# Patient Record
Sex: Female | Born: 1970 | Race: Black or African American | Hispanic: No | Marital: Single | Smoking: Never smoker
Health system: Southern US, Community
[De-identification: ages and names within clinical notes are randomized; demographics above are authoritative.]

## PROBLEM LIST (undated history)

## (undated) DIAGNOSIS — R0602 Shortness of breath: Secondary | ICD-10-CM

## (undated) DIAGNOSIS — R002 Palpitations: Secondary | ICD-10-CM

## (undated) DIAGNOSIS — K219 Gastro-esophageal reflux disease without esophagitis: Secondary | ICD-10-CM

## (undated) DIAGNOSIS — F32A Depression, unspecified: Secondary | ICD-10-CM

## (undated) DIAGNOSIS — F329 Major depressive disorder, single episode, unspecified: Secondary | ICD-10-CM

## (undated) DIAGNOSIS — F419 Anxiety disorder, unspecified: Secondary | ICD-10-CM

---

## 2004-12-04 ENCOUNTER — Inpatient Hospital Stay (HOSPITAL_COMMUNITY): Admission: AD | Admit: 2004-12-04 | Discharge: 2004-12-04 | Payer: Self-pay | Admitting: Obstetrics & Gynecology

## 2004-12-29 ENCOUNTER — Encounter: Admission: RE | Admit: 2004-12-29 | Discharge: 2005-03-29 | Payer: Self-pay | Admitting: Emergency Medicine

## 2007-11-23 ENCOUNTER — Other Ambulatory Visit: Admission: RE | Admit: 2007-11-23 | Discharge: 2007-11-23 | Payer: Self-pay | Admitting: Obstetrics and Gynecology

## 2008-09-07 HISTORY — PX: LAPAROSCOPIC GASTRIC BANDING: SHX1100

## 2008-10-30 ENCOUNTER — Ambulatory Visit: Payer: Self-pay | Admitting: Radiology

## 2008-10-30 ENCOUNTER — Ambulatory Visit (HOSPITAL_BASED_OUTPATIENT_CLINIC_OR_DEPARTMENT_OTHER): Admission: RE | Admit: 2008-10-30 | Discharge: 2008-10-30 | Payer: Self-pay | Admitting: Family Medicine

## 2009-01-17 ENCOUNTER — Other Ambulatory Visit: Admission: RE | Admit: 2009-01-17 | Discharge: 2009-01-17 | Payer: Self-pay | Admitting: Obstetrics and Gynecology

## 2013-03-25 ENCOUNTER — Encounter (HOSPITAL_COMMUNITY): Payer: Self-pay | Admitting: *Deleted

## 2013-03-25 ENCOUNTER — Emergency Department (HOSPITAL_COMMUNITY)
Admission: EM | Admit: 2013-03-25 | Discharge: 2013-03-26 | Disposition: A | Discharge status: Other Acute Inpatient Hospital | Payer: Commercial Managed Care - PPO | Attending: Emergency Medicine | Admitting: Emergency Medicine

## 2013-03-25 DIAGNOSIS — Z9884 Bariatric surgery status: Secondary | ICD-10-CM | POA: Insufficient documentation

## 2013-03-25 DIAGNOSIS — M7989 Other specified soft tissue disorders: Secondary | ICD-10-CM | POA: Insufficient documentation

## 2013-03-25 DIAGNOSIS — F329 Major depressive disorder, single episode, unspecified: Secondary | ICD-10-CM

## 2013-03-25 DIAGNOSIS — F3289 Other specified depressive episodes: Secondary | ICD-10-CM | POA: Insufficient documentation

## 2013-03-25 DIAGNOSIS — Z880 Allergy status to penicillin: Secondary | ICD-10-CM | POA: Insufficient documentation

## 2013-03-25 DIAGNOSIS — R45851 Suicidal ideations: Secondary | ICD-10-CM | POA: Insufficient documentation

## 2013-03-25 DIAGNOSIS — R45850 Homicidal ideations: Secondary | ICD-10-CM | POA: Insufficient documentation

## 2013-03-25 DIAGNOSIS — Z8679 Personal history of other diseases of the circulatory system: Secondary | ICD-10-CM | POA: Insufficient documentation

## 2013-03-25 DIAGNOSIS — Z79899 Other long term (current) drug therapy: Secondary | ICD-10-CM | POA: Insufficient documentation

## 2013-03-25 HISTORY — DX: Palpitations: R00.2

## 2013-03-25 HISTORY — DX: Depression, unspecified: F32.A

## 2013-03-25 HISTORY — DX: Major depressive disorder, single episode, unspecified: F32.9

## 2013-03-25 LAB — CBC WITH DIFFERENTIAL/PLATELET
Basophils Absolute: 0 10*3/uL (ref 0.0–0.1)
Eosinophils Relative: 1 % (ref 0–5)
Lymphocytes Relative: 20 % (ref 12–46)
Neutro Abs: 7.2 10*3/uL (ref 1.7–7.7)
Platelets: 318 10*3/uL (ref 150–400)
RDW: 13.6 % (ref 11.5–15.5)
WBC: 9.8 10*3/uL (ref 4.0–10.5)

## 2013-03-25 LAB — BASIC METABOLIC PANEL
CO2: 24 mEq/L (ref 19–32)
Calcium: 9.1 mg/dL (ref 8.4–10.5)
Chloride: 100 mEq/L (ref 96–112)
Sodium: 136 mEq/L (ref 135–145)

## 2013-03-25 LAB — RAPID URINE DRUG SCREEN, HOSP PERFORMED
Cocaine: NOT DETECTED
Opiates: NOT DETECTED
Tetrahydrocannabinol: NOT DETECTED

## 2013-03-25 MED ORDER — LORAZEPAM 1 MG PO TABS
ORAL_TABLET | ORAL | Status: AC
  Administered 2013-03-25: 1 mg via ORAL
  Filled 2013-03-25: qty 1

## 2013-03-25 MED ORDER — ZOLPIDEM TARTRATE 5 MG PO TABS
10.0000 mg | ORAL_TABLET | Freq: Once | ORAL | Status: AC
  Administered 2013-03-25: 10 mg via ORAL
  Filled 2013-03-25: qty 2

## 2013-03-25 MED ORDER — LORAZEPAM 1 MG PO TABS
1.0000 mg | ORAL_TABLET | Freq: Once | ORAL | Status: AC
  Administered 2013-03-25: 1 mg via ORAL

## 2013-03-25 NOTE — ED Provider Notes (Signed)
Pt accepted to [facility], Dr. Betti Cruz. Will transfer stable.   Leslie Anger, DO 03/25/13 1751

## 2013-03-25 NOTE — ED Provider Notes (Signed)
History    This chart was scribed for Shelda Jakes, MD by Leone Payor, ED Scribe. This patient was seen in room APA15/APA15 and the patient's care was started 10:47 AM.  CSN: [id number] Arrival date & time 03/25/13  1033  First MD Initiated Contact with Patient 03/25/13 1044     Chief Complaint  Patient presents with  . V70.1    The history is provided by the patient. No language interpreter was used.    HPI Comments: Leslie Day is a 42 y.o. female with past medical history of depression who presents to the Emergency Department complaining of ongoing, constant feelings of depression and SI/HI for the past 4 days. Pt admits to a plan and states "whatever it takes" when asked. States she wants to cause harm to people that have hurt her in the past. Per pt, she has attempted suicide in the past by consuming pills. Pt denies history of DM, HTN.    PCP Dr. Elwyn Reach at [facility].  Past Medical History  Diagnosis Date  . Depression   . Palpitation    Past Surgical History  Procedure Laterality Date  . Laparoscopic gastric banding     No family history on file. History  Substance Use Topics  . Smoking status: Never Smoker   . Smokeless tobacco: Not on file  . Alcohol Use: No   OB History   Grav Para Term Preterm Abortions TAB SAB Ect Mult Living                 Review of Systems  Constitutional: Negative for fever and chills.  HENT: Negative for sore throat, rhinorrhea and neck pain.   Eyes: Negative for visual disturbance.  Respiratory: Negative for cough and shortness of breath.   Cardiovascular: Positive for leg swelling. Negative for chest pain.  Gastrointestinal: Negative for nausea, vomiting, abdominal pain and diarrhea.  Genitourinary: Negative for dysuria and hematuria.  Musculoskeletal: Negative for back pain.  Skin: Negative for rash.  Neurological: Negative for headaches.  Hematological: Does not bruise/bleed easily.   Psychiatric/Behavioral: Positive for suicidal ideas. Negative for confusion.       Homicidal ideas    Allergies  Penicillins  Home Medications   Current Outpatient Rx  Name  Route  Sig  Dispense  Refill  . escitalopram (LEXAPRO) 20 MG tablet   Oral   Take 20 mg by mouth daily.         . metoprolol tartrate (LOPRESSOR) 25 MG tablet   Oral   Take 25 mg by mouth daily as needed (for palpitations).         . valACYclovir (VALTREX) 500 MG tablet   Oral   Take 500 mg by mouth daily.          Marland Kitchen zolpidem (AMBIEN CR) 12.5 MG CR tablet   Oral   Take 12.5 mg by mouth at bedtime as needed for sleep.          BP 140/78  Pulse 75  Temp(Src) 98.4 F (36.9 C) (Oral)  Resp 18  Ht 5\' 8"  (1.727 m)  Wt 317 lb (143.79 kg)  BMI 48.21 kg/m2  SpO2 98% Physical Exam  Nursing note and vitals reviewed. Constitutional: She is oriented to person, place, and time. She appears well-developed and well-nourished.  HENT:  Head: Normocephalic and atraumatic.  Eyes: Conjunctivae and EOM are normal. Pupils are equal, round, and reactive to light.  Neck: Normal range of motion. Neck supple.  Cardiovascular:  Normal rate, regular rhythm and normal heart sounds.  Exam reveals no gallop and no friction rub.   No murmur heard. Pulmonary/Chest: Effort normal and breath sounds normal. No respiratory distress. She has no wheezes. She has no rales. She exhibits no tenderness.  Abdominal: Soft. Bowel sounds are normal. She exhibits no distension and no mass. There is no tenderness. There is no rebound and no guarding.  Musculoskeletal: Normal range of motion.  Swelling on bilateral legs but not pitting edema.   Neurological: She is alert and oriented to person, place, and time.  Skin: Skin is warm and dry.  Psychiatric: She has a normal mood and affect.    ED Course  Procedures (including critical care time)  DIAGNOSTIC STUDIES: Oxygen Saturation is 100% on RA, normal by my interpretation.     COORDINATION OF CARE: 11:47 AM Discussed treatment plan with pt at bedside and pt agreed to plan.   Labs Reviewed  BASIC METABOLIC PANEL - Abnormal; Notable for the following:    Glucose, Bld 101 (*)    GFR calc non Af Amer 66 (*)    GFR calc Af Amer 76 (*)    All other components within normal limits  CBC WITH DIFFERENTIAL  URINE RAPID DRUG SCREEN (HOSP PERFORMED)   Results for orders placed during the hospital encounter of 03/25/13  CBC WITH DIFFERENTIAL      Result Value Range   WBC 9.8  4.0 - 10.5 K/uL   RBC 4.62  3.87 - 5.11 MIL/uL   Hemoglobin 12.7  12.0 - 15.0 g/dL   HCT 16.1  09.6 - 04.5 %   MCV 84.6  78.0 - 100.0 fL   MCH 27.5  26.0 - 34.0 pg   MCHC 32.5  30.0 - 36.0 g/dL   RDW 40.9  81.1 - 91.4 %   Platelets 318  150 - 400 K/uL   Neutrophils Relative % 74  43 - 77 %   Neutro Abs 7.2  1.7 - 7.7 K/uL   Lymphocytes Relative 20  12 - 46 %   Lymphs Abs 2.0  0.7 - 4.0 K/uL   Monocytes Relative 4  3 - 12 %   Monocytes Absolute 0.4  0.1 - 1.0 K/uL   Eosinophils Relative 1  0 - 5 %   Eosinophils Absolute 0.1  0.0 - 0.7 K/uL   Basophils Relative 0  0 - 1 %   Basophils Absolute 0.0  0.0 - 0.1 K/uL  BASIC METABOLIC PANEL      Result Value Range   Sodium 136  135 - 145 mEq/L   Potassium 3.5  3.5 - 5.1 mEq/L   Chloride 100  96 - 112 mEq/L   CO2 24  19 - 32 mEq/L   Glucose, Bld 101 (*) 70 - 99 mg/dL   BUN 11  6 - 23 mg/dL   Creatinine, Ser 7.82  0.50 - 1.10 mg/dL   Calcium 9.1  8.4 - 95.6 mg/dL   GFR calc non Af Amer 66 (*) >90 mL/min   GFR calc Af Amer 76 (*) >90 mL/min  URINE RAPID DRUG SCREEN (HOSP PERFORMED)      Result Value Range   Opiates NONE DETECTED  NONE DETECTED   Cocaine NONE DETECTED  NONE DETECTED   Benzodiazepines NONE DETECTED  NONE DETECTED   Amphetamines NONE DETECTED  NONE DETECTED   Tetrahydrocannabinol NONE DETECTED  NONE DETECTED   Barbiturates NONE DETECTED  NONE DETECTED    Date: 03/25/2013  Rate: 85  Rhythm: normal sinus rhythm  QRS  Axis: normal  Intervals: normal  ST/T Wave abnormalities: normal  Conduction Disutrbances:none  Narrative Interpretation:   Old EKG Reviewed: none available      No results found. 1. Suicidal ideation   2. Depression     MDM  Patient medically cleared for behavioral health evaluation. Patient has strong feelings of suicidal ideation and even expresses some homicidal ideation will most likely require psychiatric admission unless things change. Patient is currently voluntary.  I personally performed the services described in this documentation, which was scribed in my presence. The recorded information has been reviewed and is accurate.    Shelda Jakes, MD 03/25/13 [phone]

## 2013-03-25 NOTE — ED Notes (Signed)
Patient states "I feel so restless."  Dr. Deretha Emory notified, order recvd.

## 2013-03-25 NOTE — ED Notes (Signed)
Pt requesting something to help her sleep. EDP notified.

## 2013-03-25 NOTE — ED Notes (Signed)
Pt c/o depression, no appetite, SI/HI for the past 4 days, pt admits to plan, when asked what the plan is pt states "whatever it takes", admits to HI also. Pt tearful at triage.

## 2013-03-25 NOTE — ED Notes (Signed)
Patient unable to fit into xxl paper scrub pant.  Allowed to keep on her own pants, no strings on pants.  Patient wanded.

## 2013-03-25 NOTE — ED Notes (Signed)
[name] notified that pt will not be arriving until tomorrow.

## 2013-03-25 NOTE — BH Assessment (Signed)
[facility] Assessment Progress Note     Patient has been accepted by Dr. Betti Cruz at [facility]. She is going to [facility] [address] She will transfer by [facility]. Her friend is going to get her clothes and other personal items for her to have while she receives treatment. Patient has signed the voluntary admission form. She is apprehensive but appropriately so, as she has never received services for inpatient stabilization. Patient was given specific instruction from RN on what could be brought to the facility. Friend is very supportive. Explained some of the protocols; etc. Patient re-assured.  Shon Baton, LCSW, LCASA

## 2013-03-25 NOTE — BH Assessment (Signed)
Assessment Note   Leslie Day is an 42 y.o. female. She presents voluntarily in the emergency room with depression. She states she has experienced depression since she was an adolescent/young adult. She has been prescribed Zoloft some time ago, and noticed an improvement. After a long period of time, she thought she didn't need it anymore and stopped taking it. Over the past 6 months, she has steadily become more and more depressed. Her PCP started her on Lexapro about 2 months ago, 10mg  at first, then increased to 20mg ; but she has not noticed a significant improvement. Her depression as gotten somewhat worse by her report: she is not eating much-just picking at food; she is not sleeping-waking up around 2 or 3 AM and walking the floors until it is time for her to get ready for work. This week, she has had continuous crying spells. Her friend expressed great concern as the crying spells are lasting longer and more intensive. She has noted some increase in anxiety, noting it is this "constant worrying," that starts in the morning right before she goes to work. She feels that this is starting to impact her social and work environments. At work, she is irritable, agitated and has difficulty concentrating on tasks. 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 She notes the worrying has also gotten much worse this past week. She has been having thoughts of Suicide; she expresses that this had been passing thoughts of a while, but it is now more of a intentional/planned thought process, with feelings of wanting to take pills. She states she doesn't want to, she wants to get helps so she doesn't feel like this anymore. Recently, she has had thoughts of homicidal ideation; no specific person mentioned, but she reports she can relate to those in the media who do "things like that." She has no previous  issues with harming anyone; no assaults and no real thoughts of really harming others-these are just passing thoughts as described by the patient. She does have a family history of depression; reporting that her mother was treated for depression, though she never knew it until she was older. She states that her mother took Zoloft. She does not have any delusions or hallucinations. No history of substance abuse reported by the patient. She is flat, unexpressive and very apprehensive. She states she is scared of losing her job as she knows she is getting worse and can't seem to "stop how I feel." She has no previous outpatient therapy or inpatient Behavioral health services. Because of her increasing crying spells, thoughts of harming herself by taking an overdose, it is recommended the patient be treated inpatient for her safety. Patient is agreeable to sign herself in; though she expresses being "scared" as she has never had to do this before.   Axis I: Major Depression, Recurrent severe Axis II: Deferred Axis III: See medical history Axis IV: Financial issues; depression is impacting her ability to socialize and her job performance Axis V: GAF 15-25  Past Medical History:  Past Medical History  Diagnosis Date  . Depression   . Palpitation     Past Surgical History  Procedure Laterality Date  . Laparoscopic gastric banding      Family History: No family history on file.  Social History:  reports that she has never smoked. She does not have any  smokeless tobacco history on file. She reports that she does not drink alcohol or use illicit drugs.  Additional Social History:     CIWA: CIWA-Ar BP: 140/78 mmHg Pulse Rate: 75 COWS:    Allergies:  Allergies  Allergen Reactions  . Penicillins     Rash     Home Medications:  (Not in a hospital admission)  OB/GYN Status:  No LMP recorded. Patient has had an injection.  General Assessment Data Location of Assessment: [facility]  Assessment: Yes Living Arrangements: Alone Can pt return to current living arrangement?: Yes Admission Status: Voluntary Is patient capable of signing voluntary admission?: Yes Transfer from: Acute Hospital Referral Source: MD  Education Status Is patient currently in school?: No  Risk to self Suicidal Ideation: Yes-Currently Present Suicidal Intent: Yes-Currently Present Is patient at risk for suicide?: Yes Suicidal Plan?: Yes-Currently Present Specify Current Suicidal Plan:  (has thought about taking pills-OD) Access to Means: Yes Specify Access to Suicidal Means:  (pills) What has been your use of drugs/alcohol within the last 12 months?:  (none) Previous Attempts/Gestures: No Recent stressful life event(s): Financial Problems Persecutory voices/beliefs?: No Depression: Yes Depression Symptoms: Tearfulness;Isolating;Loss of interest in usual pleasures;Feeling angry/irritable Substance abuse history and/or treatment for substance abuse?: No Suicide prevention information given to non-admitted patients: Not applicable  Risk to Others Homicidal Ideation: Yes-Currently Present Thoughts of Harm to Others: No-Not Currently Present/Within Last 6 Months Current Homicidal Intent: No Current Homicidal Plan: No Access to Homicidal Means: No History of harm to others?: No Does patient have access to weapons?: No Criminal Charges Pending?: No Does patient have a court date: No  Psychosis Hallucinations: None noted Delusions: None noted  Mental Status Report Appear/Hygiene: Improved Eye Contact: Fair Motor Activity: Unremarkable Speech: Logical/coherent;Soft Level of Consciousness: Quiet/awake Mood: Depressed;Apprehensive;Helpless;Preoccupied Affect: Apprehensive;Appropriate to circumstance;Depressed Anxiety Level: Minimal Thought Processes: Relevant Judgement: Unimpaired Orientation: Person;Place;Time;Situation;Appropriate for developmental age Obsessive Compulsive  Thoughts/Behaviors: Minimal  Cognitive Functioning Concentration: Decreased Memory: Recent Intact;Remote Intact IQ: Average Insight: Fair Impulse Control: Fair Appetite: Poor Weight Loss:  (5) Sleep: Decreased Total Hours of Sleep: 4 Vegetative Symptoms: None  ADLScreening [facility]) Patient's cognitive ability adequate to safely complete daily activities?: Yes Patient able to express need for assistance with ADLs?: Yes Independently performs ADLs?: Yes (appropriate for developmental age)  Abuse/Neglect [facility]) Physical Abuse: Denies Verbal Abuse: Denies Sexual Abuse: Denies  Prior Inpatient Therapy Prior Inpatient Therapy: No  Prior Outpatient Therapy Prior Outpatient Therapy: No  ADL Screening (condition at time of admission) Patient's cognitive ability adequate to safely complete daily activities?: Yes Patient able to express need for assistance with ADLs?: Yes Independently performs ADLs?: Yes (appropriate for developmental age)       Abuse/Neglect Assessment (Assessment to be complete while patient is alone) Physical Abuse: Denies Verbal Abuse: Denies Sexual Abuse: Denies Values / Beliefs Cultural Requests During Hospitalization: None Spiritual Requests During Hospitalization: None        Additional Information 1:1 In Past 12 Months?: No CIRT Risk: No Elopement Risk: No Does patient have medical clearance?: Yes     Disposition:  Disposition Initial Assessment Completed for this Encounter: Yes Disposition of Patient: Inpatient treatment program Type of inpatient treatment program: Adult  On Site Evaluation by:  Dr. Deretha Emory Reviewed with Physician:  Dr. Darleene Cleaver, Giavonni Cizek H 03/25/2013 3:45 PM

## 2013-04-24 ENCOUNTER — Encounter (HOSPITAL_COMMUNITY): Payer: Self-pay | Admitting: *Deleted

## 2013-04-24 ENCOUNTER — Observation Stay (HOSPITAL_COMMUNITY)
Admission: EM | Admit: 2013-04-24 | Discharge: 2013-04-25 | Disposition: A | Discharge status: Home / Self Care | Payer: Commercial Managed Care - PPO | Attending: Internal Medicine | Admitting: Internal Medicine

## 2013-04-24 ENCOUNTER — Emergency Department (HOSPITAL_COMMUNITY): Payer: Commercial Managed Care - PPO

## 2013-04-24 DIAGNOSIS — K219 Gastro-esophageal reflux disease without esophagitis: Secondary | ICD-10-CM | POA: Diagnosis present

## 2013-04-24 DIAGNOSIS — F32A Depression, unspecified: Secondary | ICD-10-CM | POA: Diagnosis present

## 2013-04-24 DIAGNOSIS — F329 Major depressive disorder, single episode, unspecified: Secondary | ICD-10-CM | POA: Diagnosis present

## 2013-04-24 DIAGNOSIS — R002 Palpitations: Secondary | ICD-10-CM | POA: Diagnosis present

## 2013-04-24 DIAGNOSIS — Z79899 Other long term (current) drug therapy: Secondary | ICD-10-CM | POA: Insufficient documentation

## 2013-04-24 DIAGNOSIS — F3289 Other specified depressive episodes: Secondary | ICD-10-CM | POA: Insufficient documentation

## 2013-04-24 DIAGNOSIS — R079 Chest pain, unspecified: Secondary | ICD-10-CM

## 2013-04-24 DIAGNOSIS — R0789 Other chest pain: Principal | ICD-10-CM | POA: Diagnosis present

## 2013-04-24 HISTORY — DX: Shortness of breath: R06.02

## 2013-04-24 HISTORY — DX: Anxiety disorder, unspecified: F41.9

## 2013-04-24 HISTORY — DX: Gastro-esophageal reflux disease without esophagitis: K21.9

## 2013-04-24 LAB — CBC WITH DIFFERENTIAL/PLATELET
Basophils Relative: 0 % (ref 0–1)
Eosinophils Absolute: 0.1 10*3/uL (ref 0.0–0.7)
Eosinophils Relative: 2 % (ref 0–5)
Hemoglobin: 12.1 g/dL (ref 12.0–15.0)
MCH: 28.1 pg (ref 26.0–34.0)
MCHC: 33.2 g/dL (ref 30.0–36.0)
MCV: 84.7 fL (ref 78.0–100.0)
Monocytes Absolute: 0.5 10*3/uL (ref 0.1–1.0)
Monocytes Relative: 5 % (ref 3–12)
Neutrophils Relative %: 68 % (ref 43–77)

## 2013-04-24 LAB — BASIC METABOLIC PANEL
BUN: 14 mg/dL (ref 6–23)
Calcium: 8.7 mg/dL (ref 8.4–10.5)
Creatinine, Ser: 0.91 mg/dL (ref 0.50–1.10)
GFR calc Af Amer: 88 mL/min — ABNORMAL LOW (ref >90)
GFR calc non Af Amer: 76 mL/min — ABNORMAL LOW (ref >90)
Potassium: 3.7 mEq/L (ref 3.5–5.1)

## 2013-04-24 LAB — CREATININE, SERUM: GFR calc Af Amer: >90 mL/min (ref >90)

## 2013-04-24 LAB — TROPONIN I: Troponin I: <0.3 ng/mL (ref <0.30)

## 2013-04-24 LAB — CBC
HCT: 35 % — ABNORMAL LOW (ref 36.0–46.0)
MCH: 28.5 pg (ref 26.0–34.0)
MCV: 84.5 fL (ref 78.0–100.0)
RBC: 4.14 MIL/uL (ref 3.87–5.11)
RDW: 14.3 % (ref 11.5–15.5)
WBC: 10.8 10*3/uL — ABNORMAL HIGH (ref 4.0–10.5)

## 2013-04-24 MED ORDER — PANTOPRAZOLE SODIUM 40 MG PO TBEC
40.0000 mg | DELAYED_RELEASE_TABLET | Freq: Every day | ORAL | Status: DC
  Administered 2013-04-25: 40 mg via ORAL
  Filled 2013-04-24: qty 1

## 2013-04-24 MED ORDER — ACETAMINOPHEN 325 MG PO TABS: 650.0000 mg | ORAL_TABLET | Freq: Four times a day (QID) | ORAL | Status: DC | PRN

## 2013-04-24 MED ORDER — ACETAMINOPHEN 650 MG RE SUPP: 650.0000 mg | Freq: Four times a day (QID) | RECTAL | Status: DC | PRN

## 2013-04-24 MED ORDER — NITROGLYCERIN 0.3 MG SL SUBL
0.3000 mg | SUBLINGUAL_TABLET | SUBLINGUAL | Status: DC | PRN
  Filled 2013-04-24: qty 100

## 2013-04-24 MED ORDER — VALACYCLOVIR HCL 500 MG PO TABS
500.0000 mg | ORAL_TABLET | Freq: Every day | ORAL | Status: DC
  Filled 2013-04-24: qty 1

## 2013-04-24 MED ORDER — ONDANSETRON HCL 4 MG/2ML IJ SOLN: 4.0000 mg | Freq: Four times a day (QID) | INTRAMUSCULAR | Status: DC | PRN

## 2013-04-24 MED ORDER — HYDROCODONE-ACETAMINOPHEN 5-325 MG PO TABS: 1.0000 | ORAL_TABLET | Freq: Four times a day (QID) | ORAL | Status: DC | PRN

## 2013-04-24 MED ORDER — SODIUM CHLORIDE 0.9 % IJ SOLN: 3.0000 mL | Freq: Two times a day (BID) | INTRAMUSCULAR | Status: DC

## 2013-04-24 MED ORDER — ENOXAPARIN SODIUM 40 MG/0.4ML ~~LOC~~ SOLN
40.0000 mg | SUBCUTANEOUS | Status: DC
  Filled 2013-04-24 (×2): qty 0.4

## 2013-04-24 MED ORDER — ZOLPIDEM TARTRATE 5 MG PO TABS
5.0000 mg | ORAL_TABLET | Freq: Every evening | ORAL | Status: DC | PRN
  Administered 2013-04-24: 5 mg via ORAL
  Filled 2013-04-24: qty 1

## 2013-04-24 MED ORDER — ALUM & MAG HYDROXIDE-SIMETH 200-200-20 MG/5ML PO SUSP: 30.0000 mL | Freq: Four times a day (QID) | ORAL | Status: DC | PRN

## 2013-04-24 MED ORDER — METOPROLOL TARTRATE 25 MG PO TABS
25.0000 mg | ORAL_TABLET | Freq: Every day | ORAL | Status: DC | PRN
  Filled 2013-04-24: qty 1

## 2013-04-24 MED ORDER — ONDANSETRON HCL 4 MG PO TABS: 4.0000 mg | ORAL_TABLET | Freq: Four times a day (QID) | ORAL | Status: DC | PRN

## 2013-04-24 MED ORDER — NITROGLYCERIN 0.4 MG SL SUBL: 0.4000 mg | SUBLINGUAL_TABLET | SUBLINGUAL | Status: DC | PRN

## 2013-04-24 MED ORDER — ZOLPIDEM TARTRATE 5 MG PO TABS: 10.0000 mg | ORAL_TABLET | Freq: Every evening | ORAL | Status: DC | PRN

## 2013-04-24 MED ORDER — ESCITALOPRAM OXALATE 20 MG PO TABS
20.0000 mg | ORAL_TABLET | Freq: Every day | ORAL | Status: DC
  Administered 2013-04-24: 20 mg via ORAL
  Filled 2013-04-24 (×2): qty 1

## 2013-04-24 MED ORDER — ASPIRIN 81 MG PO CHEW
324.0000 mg | CHEWABLE_TABLET | Freq: Once | ORAL | Status: AC
  Administered 2013-04-24: 324 mg via ORAL
  Filled 2013-04-24: qty 4

## 2013-04-24 MED ORDER — SODIUM CHLORIDE 0.9 % IV SOLN
INTRAVENOUS | Status: DC
  Administered 2013-04-24: 20:00:00 via INTRAVENOUS

## 2013-04-24 MED ORDER — LORAZEPAM 1 MG PO TABS: 1.0000 mg | ORAL_TABLET | Freq: Three times a day (TID) | ORAL | Status: DC | PRN

## 2013-04-24 NOTE — ED Notes (Signed)
Pt reports feeling irregular HR on SAT. Pt reported feeling nauseated at that time. The irregular HR started this AM while at work. Pt sent to ED for further eval.

## 2013-04-24 NOTE — ED Provider Notes (Signed)
Transfer of care form Leslie Pel, PA-C at change in shift.  Leslie Day is a 42 y/o F with PMHx of palpitations and PVCs, depression presenting to the ED with chest discomfort that started yesterday and an episode that occurred this morning while at work - EKG performed while at work, PVCs noted and patient came to the ED. Patient presenting to the ED with shortness of breath with episodes of sweating. Patient reported that she has not felt well since Saturday - reported that on Saturday for had an acute onset of diaphoresis, shortness of breath, and nausea that lasted approximately a couple of hours.   Alert and oriented. Pleasant upon exam. Full ROM to upper and lower extremities. Pulses 2+/2+ to radial and DP, bilaterally. Heart rate and rhythm normal. Lungs clear to auscultation bilaterally. Cap refill < 3 seconds. Mild swelling noted to the ankles and legs bilaterally with negative pitting edema noted. Negative respiratory distress noted. Full sentences without difficulty.   Chest xray negative findings. CBC negative findings. BMP negative findings. First set of Troponins negative elevation noted. Second set of troponins to be ordered.   Discussed with patient that she will most likely be admitted to the hospital for cardiac rule out - placed in observation. Patient understood, agreed to plan, all questions answered.   5:20PM Spoke with Dr. Isidoro Donning, [facility], discussed case with Dr. Isidoro Donning - patient to be admitted to observation to Telemetry for cardiac rule out, Team 10. Dr. Isidoro Donning to come see patient.   5:24PM Dr. Isidoro Donning down to see patient. Patient admitted and orders placed.   Raymon Mutton, PA-C 04/25/13 [phone]

## 2013-04-24 NOTE — H&P (Signed)
History and Physical       Hospital Admission Note Date: 04/24/2013  Patient name: Leslie Day Medical record number: [id number] Date of birth: 1970/11/20 Age: 42 y.o. Gender: female PCP: BOOTH, SHERI L, MD    Chief Complaint:  Palpitations with chest discomfort  HPI: Patient is a 42 year old obese female with past medical history of depression and chronic palpitations presented to ED with complaints of worsening palpitations and chest discomfort for last 2 days. History was obtained from the patient who stated that she has history of chronic palpitations for years. She has seen cardiologist in [place] in 2012, had an echo done which was normal. Subsequently she had a Holter monitor in 2010 and and had shown '682 PVC's in 24hours'. She has no history of coronary artery disease or cardiac cath. She denies any history of any recent long-distance travels. She denies any smoking or alcohol use. She does report that she was started on Abilify 2 mg daily by Dr. Renato Gails, psychiatrist in [place] 3 weeks ago for depression. She has noticed the palpitations worsening after starting Abilify. Per patient, she has been feeling nauseous, diaphoretic, palpitations and off and on chest pain episodes since that day 2 days ago. The chest pain is mid-sternal, without radiation lasting for a few seconds, with no relation with exertion, comes and goes. She states she has a history of GERD but this chest pain is different EKG showed PVCs, on monitor patient has normal sinus rhythm. Hospitalist service was requested for admission for observation and further workup given the new symptoms of chest pain.  Review of Systems:  Constitutional: Denies fever, chills, diaphoresis, poor appetite and fatigue.  HEENT: Denies photophobia, eye pain, redness, hearing loss, ear pain, congestion, sore throat, rhinorrhea, sneezing, mouth sores, trouble swallowing, neck pain,  neck stiffness and tinnitus.   Respiratory: Denies SOB, DOE, cough, chest tightness,  and wheezing.   Cardiovascular: Please see history of present illness Gastrointestinal: Denies  vomiting, abdominal pain, diarrhea, constipation, blood in stool and abdominal distention. + nausea Genitourinary: Denies dysuria, urgency, frequency, hematuria, flank pain and difficulty urinating.  Musculoskeletal: Denies myalgias, back pain, joint swelling, arthralgias and gait problem.  Skin: Denies pallor, rash and wound.  Neurological: Denies dizziness, seizures, syncope, weakness, light-headedness, numbness and headaches.  Hematological: Denies adenopathy. Easy bruising, personal or family bleeding history  Psychiatric/Behavioral: Denies suicidal ideation, mood changes, confusion, nervousness, sleep disturbance and agitation  Past Medical History: Past Medical History  Diagnosis Date  . Depression   . Palpitation    Past Surgical History  Procedure Laterality Date  . Laparoscopic gastric banding      Medications: Prior to Admission medications   Medication Sig Start Date End Date Taking? Authorizing Provider  ARIPiprazole (ABILIFY) 2 MG tablet Take 2 mg by mouth at bedtime.   Yes Historical Provider, MD  escitalopram (LEXAPRO) 20 MG tablet Take 20 mg by mouth at bedtime.    Yes Historical Provider, MD  metoprolol tartrate (LOPRESSOR) 25 MG tablet Take 25 mg by mouth daily as needed (for palpitations).   Yes Historical Provider, MD  valACYclovir (VALTREX) 500 MG tablet Take 500 mg by mouth daily.    Yes Historical Provider, MD  zolpidem (AMBIEN CR) 12.5 MG CR tablet Take 12.5 mg by mouth at bedtime as needed for sleep.   Yes Historical Provider, MD    Allergies:   Allergies  Allergen Reactions  . Penicillins     Rash     Social History:  reports that  she has never smoked. She does not have any smokeless tobacco history on file. She reports that she does not drink alcohol or use illicit  drugs.  Family History: Patient reports that her grandmother had coronary disease, mother died of pulmonary embolism Physical Exam: Blood pressure 115/55, pulse 76, temperature 99.2 F (37.3 C), temperature source Oral, resp. rate 18, SpO2 100.00%. General: Alert, awake, oriented x3, in no acute distress. HEENT: normocephalic, atraumatic, anicteric sclera, pink conjunctiva, pupils equal and reactive to light and accomodation, oropharynx clear Neck: supple, no masses or lymphadenopathy, no goiter, no bruits  Heart: Regular rate and rhythm, without murmurs, rubs or gallops. Lungs: Clear to auscultation bilaterally, no wheezing, rales or rhonchi. Abdomen: obese, Soft, nontender, nondistended, positive bowel sounds, no masses. Extremities: No clubbing, cyanosis or edema with positive pedal pulses. Neuro: Grossly intact, no focal neurological deficits, strength 5/5 upper and lower extremities bilaterally Psych: alert and oriented x 3, normal mood and affect Skin: no rashes or lesions, warm and dry   LABS on Admission:  Basic Metabolic Panel:  Recent Labs Lab 04/24/13 1445  NA 138  K 3.7  CL 105  CO2 24  GLUCOSE 157*  BUN 14  CREATININE 0.91  CALCIUM 8.7   CBC:  Recent Labs Lab 04/24/13 1445  WBC 9.5  NEUTROABS 6.5  HGB 12.1  HCT 36.4  MCV 84.7  PLT 317   Cardiac Enzymes:  Recent Labs Lab 04/24/13 1445  TROPONINI <0.30   BNP: No components found with this basename: POCBNP,  CBG: No results found for this basename: GLUCAP,  in the last 168 hours   Radiological Exams on Admission: Dg Chest 2 View  04/24/2013   *RADIOLOGY REPORT*  Clinical Data: Heart palpitations.  Shortness of breath.  CHEST - 2 VIEW  Comparison: None.  Findings: Lungs are clear.  Heart size is normal.  No pneumothorax or pleural effusion.  IMPRESSION: Negative chest.   Original Report Authenticated By: Holley Dexter, M.D.  EKG showed rate of 95, normal sinus rhythm,  PVCs  Assessment/Plan Principal Problem:   Palpitation with atypical chest pain - Patient has a known history of chronic palpitations and was started on beta blocker as needed. Will admit overnight for observation on telemetry monitor to rule out any arrhythmias or paroxysmal A. fib  - Obtain TSH, D. dimer, urine drug screen, urine pregnancy test, rule out ACS, lipid panel - Discussed with cardiology, Dr. Jacinto Halim, will see patient for further recommendations - Will defer to cardiology for further workup, holter monitor/loop recorder, stress test vs stress ECHO   Active Problems: GERD: - Place on PPI    Depression - Continue Lexapro. Ativan as needed for anxiety - DC Abilify, advised patient to call her psychiatrist to make followup appointment.    DVT prophylaxis: Lovenox  CODE STATUS: Full Code  Further plan will depend as patient's clinical course evolves and further radiologic and laboratory data become available.   Time Spent on Admission: 1 hour  Wanell Lorenzi M.D. [facility] 04/24/2013, 5:48 PM Pager: [phone]  If 7PM-7AM, please contact night-coverage [email or web address] Password [email or web address]

## 2013-04-24 NOTE — ED Provider Notes (Signed)
CSN: [id number]     Arrival date & time 04/24/13  1355 History     First MD Initiated Contact with Patient 04/24/13 1358     Chief Complaint  Patient presents with  . Chest Pain   (Consider location/radiation/quality/duration/timing/severity/associated sxs/prior Treatment) HPI  Leslie Day is a morbidly obese 42 y.o.female with a significant PMH of depression and palpitation presents to the ER with complaints of palpitations this morning. She has a history of heart palpitations for which she did a halter monitor and was told that she was having PVC's but nothing was done. They also did an cardiac echo 3 years ago.  She endorses that for the past 4 years when she walks or increases her activity she gets sharp chest pains, SOB and some pains that radiate up into her jaw. She saw a cardiologist  In [place], once many years ago. No cath, no history of MI, no cigarettes, no Alcohol usage.   Yesterday pt said that she was diaphoretic, nauseous and tired and some chest pains. She recently started 2 mg Abilify and feels that this has increased her palpitations. Today she had some chest pain that lasted only a couple of seconds with some nausea and lethargy.     Past Medical History  Diagnosis Date  . Depression   . Palpitation    Past Surgical History  Procedure Laterality Date  . Laparoscopic gastric banding     No family history on file. History  Substance Use Topics  . Smoking status: Never Smoker   . Smokeless tobacco: Not on file  . Alcohol Use: No   OB History   Grav Para Term Preterm Abortions TAB SAB Ect Mult Living                 Review of Systems  ROS is negative unless otherwise stated in the HPI   Allergies  Penicillins  Home Medications   Current Outpatient Rx  Name  Route  Sig  Dispense  Refill  . escitalopram (LEXAPRO) 20 MG tablet   Oral   Take 20 mg by mouth daily.         . metoprolol tartrate (LOPRESSOR) 25 MG tablet   Oral   Take 25 mg by  mouth daily as needed (for palpitations).         . valACYclovir (VALTREX) 500 MG tablet   Oral   Take 500 mg by mouth daily.          Marland Kitchen zolpidem (AMBIEN CR) 12.5 MG CR tablet   Oral   Take 12.5 mg by mouth at bedtime as needed for sleep.          BP 121/91  Pulse 91  Temp(Src) 99.2 F (37.3 C) (Oral)  Resp 22  SpO2 100% Physical Exam  Nursing note and vitals reviewed. Constitutional: She appears well-developed and well-nourished. No distress.  HENT:  Head: Normocephalic and atraumatic.  Eyes: Pupils are equal, round, and reactive to light.  Neck: Normal range of motion. Neck supple.  Cardiovascular: Normal rate and regular rhythm.   Pulmonary/Chest: Effort normal.  Abdominal: Soft.  Neurological: She is alert.  Skin: Skin is warm and dry.    ED Course   Procedures (including critical care time)  Labs Reviewed - No data to display No results found. No diagnosis found.  MDM   Date: 04/24/2013  Rate: 95  Rhythm: sinus rhythm  QRS Axis: normal  Intervals: normal  ST/T Wave abnormalities: normal  Conduction Disutrbances:multiple  PVC's  Narrative Interpretation:   Old EKG Reviewed: unchanged    End of shift patient care handed off to oncoming PA-C. Due to exertional angina symptoms, if all labs are normal I recommend admittance for cardiac r/o   Dorthula Matas, PA-C 04/24/13 1503  Dorthula Matas, PA-C 04/24/13 1503

## 2013-04-25 LAB — DRUGS OF ABUSE SCREEN W/O ALC, ROUTINE URINE
Amphetamine Screen, Ur: NEGATIVE
Barbiturate Quant, Ur: NEGATIVE
Benzodiazepines.: NEGATIVE
Marijuana Metabolite: POSITIVE — AB
Methadone: NEGATIVE
Opiate Screen, Urine: NEGATIVE

## 2013-04-25 LAB — URINALYSIS, ROUTINE W REFLEX MICROSCOPIC
Bilirubin Urine: NEGATIVE
Hgb urine dipstick: NEGATIVE
Ketones, ur: NEGATIVE mg/dL
Protein, ur: NEGATIVE mg/dL
Urobilinogen, UA: 1 mg/dL (ref 0.0–1.0)

## 2013-04-25 LAB — TROPONIN I: Troponin I: <0.3 ng/mL (ref <0.30)

## 2013-04-25 LAB — URINE MICROSCOPIC-ADD ON

## 2013-04-25 LAB — CBC
HCT: 33.9 % — ABNORMAL LOW (ref 36.0–46.0)
Hemoglobin: 10.9 g/dL — ABNORMAL LOW (ref 12.0–15.0)
MCH: 27.3 pg (ref 26.0–34.0)
RBC: 4 MIL/uL (ref 3.87–5.11)

## 2013-04-25 LAB — BASIC METABOLIC PANEL
BUN: 13 mg/dL (ref 6–23)
Chloride: 107 mEq/L (ref 96–112)
GFR calc non Af Amer: >90 mL/min (ref >90)
Glucose, Bld: 115 mg/dL — ABNORMAL HIGH (ref 70–99)
Potassium: 3.7 mEq/L (ref 3.5–5.1)
Sodium: 138 mEq/L (ref 135–145)

## 2013-04-25 LAB — LIPID PANEL
Cholesterol: 128 mg/dL (ref 0–200)
Triglycerides: 97 mg/dL (ref <150)

## 2013-04-25 LAB — TSH: TSH: 2.805 u[IU]/mL (ref 0.350–4.500)

## 2013-04-25 LAB — POCT I-STAT TROPONIN I

## 2013-04-25 NOTE — ED Provider Notes (Signed)
Medical screening examination/treatment/procedure(s) were performed by non-physician practitioner and as supervising physician I was immediately available for consultation/collaboration.  Kisean Rollo R. Sharnee Douglass, MD 04/25/13 1531 

## 2013-04-25 NOTE — Progress Notes (Signed)
Utilization review completed.  

## 2013-04-25 NOTE — Consult Note (Signed)
CARDIOLOGY CONSULT NOTE  Patient ID: Leslie Day MRN: [id number] DOB/AGE: Mar 13, 1971 42 y.o.  Admit date: 04/24/2013 Referring Physician  Ripu Rai, MD Primary Physician:  BOOTH, Jonna Clark, MD Reason for Consultation  Palpitations and chest pain  HPI: Patient is a 42 year old obese female with past medical history of depression and chronic palpitations presented to ED with complaints of worsening palpitations and chest discomfort for last 2 days. History was obtained from the patient who stated that she has history of chronic palpitations for years. She has seen cardiologist in [place] in 2012, had an echo done which was normal. Subsequently she had a Holter monitor in 2010 and and had shown '682 PVC's in 24hours'. She has no history of coronary artery disease or cardiac cath. She denies any history of any recent long-distance travels. She denies any smoking or alcohol use. She does report that she was started on Abilify 2 mg daily by Dr. Renato Gails, psychiatrist in [place] 3 weeks ago for depression. She has noticed the palpitations worsening after starting Abilify. Per patient, she has been feeling nauseous, diaphoretic, palpitations and off and on chest pain episodes since that day 2 days ago. The chest pain is mid-sternal, without radiation lasting for a few seconds, with no relation with exertion, comes and goes. She states she has a history of GERD but this chest pain is different. EKG showed PVCs, on monitor patient has normal sinus rhythm.   Patient this morning states that she's feeling well and has not had any recurrence. No chest pain, no PND or orthopnea. On further questioning she clearly has daytime somnolence and wakes up tired and occasionally with headaches.  Past Medical History  Diagnosis Date  . Depression   . Palpitation   . Exertional shortness of breath   . GERD (gastroesophageal reflux disease)   . Anxiety      Past Surgical History  Procedure Laterality Date  .  Laparoscopic gastric banding  2010     History reviewed. No pertinent family history.   Social History: History   Social History  . Marital Status: Single    Spouse Name: N/A    Number of Children: N/A  . Years of Education: N/A   Occupational History  . Not on file.   Social History Main Topics  . Smoking status: Never Smoker   . Smokeless tobacco: Never Used  . Alcohol Use: No  . Drug Use: Yes    Special: Marijuana     Comment: 04/24/2013 "last weed was ~ 5 days ago"  . Sexual Activity: Yes   Other Topics Concern  . Not on file   Social History Narrative  . No narrative on file     Prescriptions prior to admission  Medication Sig Dispense Refill  . ARIPiprazole (ABILIFY) 2 MG tablet Take 2 mg by mouth at bedtime.      Marland Kitchen escitalopram (LEXAPRO) 20 MG tablet Take 20 mg by mouth at bedtime.       . metoprolol tartrate (LOPRESSOR) 25 MG tablet Take 25 mg by mouth daily as needed (for palpitations).      . valACYclovir (VALTREX) 500 MG tablet Take 500 mg by mouth daily.       Marland Kitchen zolpidem (AMBIEN CR) 12.5 MG CR tablet Take 12.5 mg by mouth at bedtime as needed for sleep.        Scheduled Meds: . enoxaparin (LOVENOX) injection  40 mg Subcutaneous Q24H  . escitalopram  20 mg Oral QHS  . pantoprazole  40 mg Oral Q0600  . sodium chloride  3 mL Intravenous Q12H  . valACYclovir  500 mg Oral Daily   Continuous Infusions:  PRN Meds:.acetaminophen, acetaminophen, alum & mag hydroxide-simeth, HYDROcodone-acetaminophen, LORazepam, metoprolol tartrate, nitroGLYCERIN, ondansetron (ZOFRAN) IV, ondansetron, zolpidem  ROS: General: no fevers/chills/night sweats Eyes: no blurry vision, diplopia, or amaurosis ENT: no sore throat or hearing loss Resp: no cough, wheezing, or hemoptysis GI: no abdominal pain, nausea, vomiting, diarrhea, or constipation GU: no dysuria, frequency, or hematuria Skin: no rash Neuro: no headache, numbness, tingling, or weakness of  extremities Musculoskeletal: no joint pain or swelling Heme: no bleeding, DVT, or easy bruising Endo: no polydipsia or polyuria    Physical Exam: Blood pressure 120/70, pulse 64, temperature 98.3 F (36.8 C), temperature source Oral, resp. rate 18, SpO2 99.00%.   General appearance: alert, cooperative, appears older than stated age and morbidly obese Lungs: clear to auscultation bilaterally Heart: regular rate and rhythm, S1, S2 normal, no murmur, click, rub or gallop Abdomen: soft, non-tender; bowel sounds normal; no masses,  no organomegaly and pannus present Extremities: extremities normal, atraumatic, no cyanosis or edema Pulses: 2+ and symmetric Neurologic: Grossly normal  Labs:   Lab Results  Component Value Date   WBC 9.8 04/25/2013   HGB 10.9* 04/25/2013   HCT 33.9* 04/25/2013   MCV 84.8 04/25/2013   PLT 286 04/25/2013    Recent Labs Lab 04/25/13 0032  NA 138  K 3.7  CL 107  CO2 23  BUN 13  CREATININE 0.77  CALCIUM 8.3*  GLUCOSE 115*   Lab Results  Component Value Date   TROPONINI <0.30 04/25/2013    Lipid Panel     Component Value Date/Time   CHOL 128 04/25/2013 0032   TRIG 97 04/25/2013 0032   HDL 35* 04/25/2013 0032   CHOLHDL 3.7 04/25/2013 0032   VLDL 19 04/25/2013 0032   LDLCALC 74 04/25/2013 0032    EKG: normal EKG, normal sinus rhythm, normal intervals. No ischemia    Radiology:  Dg Chest 2 View  04/24/2013   *RADIOLOGY REPORT*  Clinical Data: Heart palpitations.  Shortness of breath.  CHEST - 2 VIEW  Comparison: None.  Findings: Lungs are clear.  Heart size is normal.  No pneumothorax or pleural effusion.  IMPRESSION: Negative chest.   Original Report Authenticated By: Holley Dexter, M.D.      ASSESSMENT AND PLAN:  1. Palpitations clearly related to PACs and PVCs. Most of the symptoms are occurring at rest and with usual activities and not with exertional activities. 2. Morbid obesity with symptoms suggestive of obstructive sleep apnea. 3.  Chest pain, clearly atypical, probably related to GERD in a patient with morbid obesity and obstructive sleep apnea versus musculoskeletal. Physical examination is otherwise unremarkable with a normal EKG.  Recommendation: No further cardiac workup is indicated. I have reassured the patient. Most of her symptoms are related to rest and not with exertional activity, suggesting benign nature of the PVCs. There is no family history of premature coronary artery disease or sudden cardiac. Unless recurrence of symptoms I be happy to see her back in the office. She does need sleep study in the outpatient basis and can follow up with her PCP. Thank for the consultation.  Pamella Pert, MD 04/25/2013, 8:56 AM [facility] Pager: [phone] Office: [phone] If no answer Cell [phone]

## 2013-04-25 NOTE — Discharge Summary (Signed)
PATIENT DETAILS Name: Leslie Day Age: 42 y.o. Sex: female Date of Birth: [birth year] MRN: [id number]. Admit Date: 04/24/2013 Admitting Physician: Cathren Harsh, MD AOZ:[id number], Jonna Clark, MD  Recommendations for Outpatient Follow-up:  1. Please refer to sleep study 2. Gen. health maintenance  PRIMARY DISCHARGE DIAGNOSIS:  Principal Problem:   Palpitation Active Problems:   Atypical chest pain   Depression   GERD (gastroesophageal reflux disease)      PAST MEDICAL HISTORY: Past Medical History  Diagnosis Date  . Depression   . Palpitation   . Exertional shortness of breath   . GERD (gastroesophageal reflux disease)   . Anxiety     DISCHARGE MEDICATIONS:   Medication List    STOP taking these medications       ARIPiprazole 2 MG tablet  Commonly known as:  ABILIFY      TAKE these medications       escitalopram 20 MG tablet  Commonly known as:  LEXAPRO  Take 20 mg by mouth at bedtime.     metoprolol tartrate 25 MG tablet  Commonly known as:  LOPRESSOR  Take 25 mg by mouth daily as needed (for palpitations).     valACYclovir 500 MG tablet  Commonly known as:  VALTREX  Take 500 mg by mouth daily.     zolpidem 12.5 MG CR tablet  Commonly known as:  AMBIEN CR  Take 12.5 mg by mouth at bedtime as needed for sleep.        ALLERGIES:   Allergies  Allergen Reactions  . Ativan [Lorazepam] Other (See Comments)    Pt states medication gave her suicidal and homicidal ideations. Was told by MD to stop taking it.   Marland Kitchen Penicillins     Rash     BRIEF HPI:  See H&P, Labs, Consult and Test reports for all details in brief, patient was admitted for valuation of palpitations  CONSULTATIONS:   cardiology  PERTINENT RADIOLOGIC STUDIES: Dg Chest 2 View  04/24/2013   *RADIOLOGY REPORT*  Clinical Data: Heart palpitations.  Shortness of breath.  CHEST - 2 VIEW  Comparison: None.  Findings: Lungs are clear.  Heart size is normal.  No pneumothorax or pleural effusion.   IMPRESSION: Negative chest.   Original Report Authenticated By: Holley Dexter, M.D.     PERTINENT LAB RESULTS: CBC:  Recent Labs  04/24/13 2003 04/25/13 0032  WBC 10.8* 9.8  HGB 11.8* 10.9*  HCT 35.0* 33.9*  PLT 284 286   CMET CMP     Component Value Date/Time   NA 138 04/25/2013 0032   K 3.7 04/25/2013 0032   CL 107 04/25/2013 0032   CO2 23 04/25/2013 0032   GLUCOSE 115* 04/25/2013 0032   BUN 13 04/25/2013 0032   CREATININE 0.77 04/25/2013 0032   CALCIUM 8.3* 04/25/2013 0032   GFRNONAA >90 04/25/2013 0032   GFRAA >90 04/25/2013 0032    GFR The CrCl is unknown because both a height and weight (above a minimum accepted value) are required for this calculation. No results found for this basename: LIPASE, AMYLASE,  in the last 72 hours  Recent Labs  04/24/13 2003 04/25/13 0032 04/25/13 0740  TROPONINI <0.30 <0.30 <0.30   No components found with this basename: POCBNP,   Recent Labs  04/24/13 1800  DDIMER 0.40   No results found for this basename: HGBA1C,  in the last 72 hours  Recent Labs  04/25/13 0032  CHOL 128  HDL 35*  LDLCALC 74  TRIG  97  CHOLHDL 3.7    Recent Labs  04/24/13 1800  TSH 2.805   No results found for this basename: VITAMINB12, FOLATE, FERRITIN, TIBC, IRON, RETICCTPCT,  in the last 72 hours Coags: No results found for this basename: PT, INR,  in the last 72 hours Microbiology: No results found for this or any previous visit (from the past 240 hour(s)).   BRIEF HOSPITAL COURSE:    Patient is a 42 year old obese female with a history of chronic palpitations, depression who was admitted to [facility] overnight as an observation as she claimed that she had worsening of her usual palpitations. She was apparently recently started on Abilify by a psychiatrist a few weeks ago. Since admission, she has had no further symptoms, telemetry has been essentially unremarkable. She has been evaluated by Dr. Jacinto Halim, cardiology on call who has  recommended no further workup at this point. She however does need a referral for a sleep study, this can be done in the outpatient setting by her primary care practitioner. Patient is not keen on restarting Abilify, I have asked her to follow up with her primary psychiatrist and take this matter up with her. At this time she is stable for discharge. Further workup can be done in the outpatient setting.  Rest of her medical issues are stable   of note-a few years ago while in [place], she's had Holter monitoring and other extensive workup for palpitations, these all have been negative except for PVCs.   TODAY-DAY OF DISCHARGE:  Subjective:   Leslie Day today has no headache,no chest abdominal pain,no new weakness tingling or numbness, feels much better wants to go home today.   Objective:   Blood pressure 120/70, pulse 64, temperature 98.3 F (36.8 C), temperature source Oral, resp. rate 18, SpO2 99.00%.  Intake/Output Summary (Last 24 hours) at 04/25/13 0949 Last data filed at 04/25/13 0700  Gross per 24 hour  Intake    360 ml  Output      0 ml  Net    360 ml   There were no vitals filed for this visit.  Exam Awake Alert, Oriented *3, No new F.N deficits, Normal affect [place].AT,PERRAL Supple Neck,No JVD, No cervical lymphadenopathy appriciated.  Symmetrical Chest wall movement, Good air movement bilaterally, CTAB RRR,No Gallops,Rubs or new Murmurs, No Parasternal Heave +ve B.Sounds, Abd Soft, Non tender, No organomegaly appriciated, No rebound -guarding or rigidity. No Cyanosis, Clubbing or edema, No new Rash or bruise  DISCHARGE CONDITION: Stable  DISPOSITION: Home  DISCHARGE INSTRUCTIONS:    Activity:  As tolerated   Diet recommendation: Heart Healthy diet       Discharge Orders   Future Orders Complete By Expires   Call MD for:  persistant dizziness or light-headedness  As directed    Diet - low sodium heart healthy  As directed    Increase activity  slowly  As directed       Follow-up Information   Follow up with BOOTH, SHERI L, MD. Schedule an appointment as soon as possible for a visit in 1 week.   Specialty:  Physician Assistant   Contact information:   [address] [place] [address] [phone]       Schedule an appointment as soon as possible for a visit with Pamella Pert, MD. (As needed)    Specialty:  Cardiology   Contact information:   [address] [place] [address] [phone]  Total Time spent on discharge equals 45 minutes.  SignedJeoffrey Massed 04/25/2013 9:49 AM

## 2013-04-26 LAB — URINE CULTURE

## 2013-04-26 NOTE — ED Provider Notes (Signed)
Medical screening examination/treatment/procedure(s) were performed by non-physician practitioner and as supervising physician I was immediately available for consultation/collaboration.   Joya Gaskins, MD 04/26/13 1252

## 2013-05-01 LAB — THC (MARIJUANA), URINE, CONFIRMATION: Marijuana, Ur-Confirmation: 94 ng/mL — ABNORMAL HIGH

## 2013-06-01 ENCOUNTER — Encounter (HOSPITAL_COMMUNITY): Payer: Self-pay

## 2013-06-01 ENCOUNTER — Emergency Department (HOSPITAL_COMMUNITY): Payer: Commercial Managed Care - PPO

## 2013-06-01 ENCOUNTER — Inpatient Hospital Stay (HOSPITAL_COMMUNITY)
Admission: EM | Admit: 2013-06-01 | Discharge: 2013-06-04 | DRG: 348 | Disposition: A | Discharge status: Home / Self Care | Payer: Commercial Managed Care - PPO | Attending: General Surgery | Admitting: General Surgery

## 2013-06-01 DIAGNOSIS — K612 Anorectal abscess: Principal | ICD-10-CM | POA: Diagnosis present

## 2013-06-01 DIAGNOSIS — K611 Rectal abscess: Secondary | ICD-10-CM

## 2013-06-01 DIAGNOSIS — K219 Gastro-esophageal reflux disease without esophagitis: Secondary | ICD-10-CM | POA: Diagnosis present

## 2013-06-01 DIAGNOSIS — F411 Generalized anxiety disorder: Secondary | ICD-10-CM | POA: Diagnosis present

## 2013-06-01 DIAGNOSIS — Z6841 Body Mass Index (BMI) 40.0 and over, adult: Secondary | ICD-10-CM

## 2013-06-01 DIAGNOSIS — F3289 Other specified depressive episodes: Secondary | ICD-10-CM | POA: Diagnosis present

## 2013-06-01 DIAGNOSIS — F329 Major depressive disorder, single episode, unspecified: Secondary | ICD-10-CM | POA: Diagnosis present

## 2013-06-01 LAB — URINALYSIS, ROUTINE W REFLEX MICROSCOPIC
Bilirubin Urine: NEGATIVE
Glucose, UA: NEGATIVE mg/dL
Ketones, ur: NEGATIVE mg/dL
Nitrite: NEGATIVE
Specific Gravity, Urine: 1.025 (ref 1.005–1.030)
pH: 6 (ref 5.0–8.0)

## 2013-06-01 LAB — CBC WITH DIFFERENTIAL/PLATELET
Basophils Absolute: 0 10*3/uL (ref 0.0–0.1)
HCT: 34.3 % — ABNORMAL LOW (ref 36.0–46.0)
Hemoglobin: 11 g/dL — ABNORMAL LOW (ref 12.0–15.0)
Lymphocytes Relative: 11 % — ABNORMAL LOW (ref 12–46)
MCH: 27.5 pg (ref 26.0–34.0)
Monocytes Absolute: 0.8 10*3/uL (ref 0.1–1.0)
Monocytes Relative: 5 % (ref 3–12)
Neutro Abs: 12.9 10*3/uL — ABNORMAL HIGH (ref 1.7–7.7)
WBC: 15.5 10*3/uL — ABNORMAL HIGH (ref 4.0–10.5)

## 2013-06-01 LAB — SURGICAL PCR SCREEN
MRSA, PCR: NEGATIVE
Staphylococcus aureus: NEGATIVE

## 2013-06-01 LAB — URINE MICROSCOPIC-ADD ON

## 2013-06-01 LAB — BASIC METABOLIC PANEL
Chloride: 103 mEq/L (ref 96–112)
GFR calc Af Amer: >90 mL/min (ref >90)
Potassium: 3.5 mEq/L (ref 3.5–5.1)
Sodium: 139 mEq/L (ref 135–145)

## 2013-06-01 MED ORDER — ONDANSETRON HCL 4 MG/2ML IJ SOLN
4.0000 mg | Freq: Four times a day (QID) | INTRAMUSCULAR | Status: DC | PRN
  Filled 2013-06-01: qty 2

## 2013-06-01 MED ORDER — METRONIDAZOLE IN NACL 5-0.79 MG/ML-% IV SOLN
500.0000 mg | Freq: Three times a day (TID) | INTRAVENOUS | Status: DC
  Administered 2013-06-01: 500 mg via INTRAVENOUS
  Administered 2013-06-02: .5 g via INTRAVENOUS
  Administered 2013-06-02 (×2): 500 mg via INTRAVENOUS
  Filled 2013-06-01 (×13): qty 100

## 2013-06-01 MED ORDER — CIPROFLOXACIN IN D5W 400 MG/200ML IV SOLN
INTRAVENOUS | Status: AC
  Filled 2013-06-01: qty 200

## 2013-06-01 MED ORDER — IOHEXOL 300 MG/ML  SOLN
100.0000 mL | Freq: Once | INTRAMUSCULAR | Status: AC | PRN
  Administered 2013-06-01: 100 mL via INTRAVENOUS

## 2013-06-01 MED ORDER — LEVOFLOXACIN IN D5W 750 MG/150ML IV SOLN
750.0000 mg | INTRAVENOUS | Status: DC
  Administered 2013-06-01: 750 mg via INTRAVENOUS
  Filled 2013-06-01 (×2): qty 150

## 2013-06-01 MED ORDER — LACTATED RINGERS IV SOLN
INTRAVENOUS | Status: DC
  Administered 2013-06-02: 07:00:00 via INTRAVENOUS

## 2013-06-01 MED ORDER — METRONIDAZOLE IN NACL 5-0.79 MG/ML-% IV SOLN
INTRAVENOUS | Status: AC
  Filled 2013-06-01: qty 200

## 2013-06-01 MED ORDER — HYDROMORPHONE HCL PF 1 MG/ML IJ SOLN
1.0000 mg | INTRAMUSCULAR | Status: DC | PRN
  Administered 2013-06-01 – 2013-06-02 (×2): 1 mg via INTRAVENOUS
  Filled 2013-06-01 (×2): qty 1

## 2013-06-01 MED ORDER — CIPROFLOXACIN IN D5W 400 MG/200ML IV SOLN
400.0000 mg | Freq: Two times a day (BID) | INTRAVENOUS | Status: DC
  Administered 2013-06-01 – 2013-06-02 (×3): 400 mg via INTRAVENOUS
  Filled 2013-06-01 (×9): qty 200

## 2013-06-01 MED ORDER — HYDROMORPHONE HCL PF 1 MG/ML IJ SOLN
1.0000 mg | INTRAMUSCULAR | Status: DC | PRN
  Administered 2013-06-01 (×2): 1 mg via INTRAVENOUS
  Filled 2013-06-01 (×2): qty 1

## 2013-06-01 MED ORDER — HYDROMORPHONE HCL PF 1 MG/ML IJ SOLN
1.0000 mg | Freq: Once | INTRAMUSCULAR | Status: AC
  Administered 2013-06-01: 1 mg via INTRAVENOUS
  Filled 2013-06-01: qty 1

## 2013-06-01 MED ORDER — ZOLPIDEM TARTRATE 5 MG PO TABS
5.0000 mg | ORAL_TABLET | Freq: Every evening | ORAL | Status: DC | PRN
  Administered 2013-06-01 (×2): 5 mg via ORAL
  Filled 2013-06-01 (×3): qty 1

## 2013-06-01 MED ORDER — SODIUM CHLORIDE 0.9 % IV SOLN
INTRAVENOUS | Status: AC
  Administered 2013-06-01: 18:00:00 via INTRAVENOUS

## 2013-06-01 MED ORDER — SODIUM CHLORIDE 0.9 % IV BOLUS (SEPSIS)
500.0000 mL | Freq: Once | INTRAVENOUS | Status: AC
  Administered 2013-06-01: 500 mL via INTRAVENOUS

## 2013-06-01 MED ORDER — PANTOPRAZOLE SODIUM 40 MG IV SOLR
40.0000 mg | Freq: Every day | INTRAVENOUS | Status: DC
  Administered 2013-06-01: 40 mg via INTRAVENOUS
  Filled 2013-06-01: qty 40

## 2013-06-01 MED ORDER — IOHEXOL 300 MG/ML  SOLN
50.0000 mL | Freq: Once | INTRAMUSCULAR | Status: AC | PRN
  Administered 2013-06-01: 50 mL via ORAL

## 2013-06-01 MED ORDER — ENOXAPARIN SODIUM 40 MG/0.4ML ~~LOC~~ SOLN
40.0000 mg | SUBCUTANEOUS | Status: DC
  Administered 2013-06-01 – 2013-06-03 (×3): 40 mg via SUBCUTANEOUS
  Filled 2013-06-01 (×3): qty 0.4

## 2013-06-01 MED ORDER — ONDANSETRON HCL 4 MG/2ML IJ SOLN
4.0000 mg | Freq: Once | INTRAMUSCULAR | Status: AC
  Administered 2013-06-01: 4 mg via INTRAVENOUS
  Filled 2013-06-01: qty 2

## 2013-06-01 NOTE — ED Notes (Signed)
Pt states she was given an Rx for pain and a cream but they are not working. States she smoked weed last night to help with the pain.

## 2013-06-01 NOTE — ED Notes (Signed)
Complain of rectal pain due to hemorrhoids

## 2013-06-01 NOTE — ED Notes (Signed)
Patient unable to provide urine specimen at this time.  Stated "it[the urine] felt like it was just about to come out but then went back up".

## 2013-06-01 NOTE — ED Notes (Signed)
Pt states pain medication has worn off. Request more pain medication. EDP aware

## 2013-06-01 NOTE — ED Provider Notes (Signed)
CSN: [id number]     Arrival date & time 06/01/13  0932 History  This chart was scribed for Leslie Hutching, MD by Quintella Reichert, ED scribe.  This patient was seen in room APA12/APA12 and the patient's care was started at 10:34 AM.   Chief Complaint  Patient presents with  . Rectal Pain    The history is provided by the patient. No language interpreter was used.    HPI Comments: Leslie Day is a 42 y.o. female who presents to the Emergency Department complaining of moderate-to-severe aching perirectal pain that began one week ago.  Pt states her pain is exacerbated by moving her bowels.  She also complains of decreased urinary flow and poor appetite along with low-grade fever as associated symptoms.  She denies abdominal pain.  She was prescribed rectal ointment and pain medication by her PCP, which she has been using without relief.     Past Medical History  Diagnosis Date  . Depression   . Palpitation   . Exertional shortness of breath   . GERD (gastroesophageal reflux disease)   . Anxiety     Past Surgical History  Procedure Laterality Date  . Laparoscopic gastric banding  2010    No family history on file.   History  Substance Use Topics  . Smoking status: Never Smoker   . Smokeless tobacco: Never Used  . Alcohol Use: No    OB History   Grav Para Term Preterm Abortions TAB SAB Ect Mult Living                  Review of Systems A complete 10 system review of systems was obtained and all systems are negative except as noted in the HPI and PMH.    Allergies  Ativan and Penicillins  Home Medications   Current Outpatient Rx  Name  Route  Sig  Dispense  Refill  . escitalopram (LEXAPRO) 20 MG tablet   Oral   Take 20 mg by mouth at bedtime.          Marland Kitchen HYDROcodone-acetaminophen (NORCO/VICODIN) 5-325 MG per tablet   Oral   Take 1 tablet by mouth every 4 (four) hours as needed for pain.         . hydrocortisone (ANUSOL-HC) 2.5 % rectal cream   Rectal    Place 1 application rectally 2 (two) times daily.         . metoprolol tartrate (LOPRESSOR) 25 MG tablet   Oral   Take 25 mg by mouth daily as needed (for palpitations).         . valACYclovir (VALTREX) 500 MG tablet   Oral   Take 500 mg by mouth daily.          Marland Kitchen zolpidem (AMBIEN CR) 12.5 MG CR tablet   Oral   Take 12.5 mg by mouth at bedtime as needed for sleep.          BP 138/57  Pulse 68  Temp(Src) 100.3 F (37.9 C) (Oral)  Resp 15  Ht 5\' 7"  (1.702 m)  Wt 300 lb (136.079 kg)  BMI 46.98 kg/m2  SpO2 97%  Physical Exam  Nursing note and vitals reviewed. Constitutional: She is oriented to person, place, and time. She appears well-developed and well-nourished.  HENT:  Head: Normocephalic and atraumatic.  Eyes: Conjunctivae and EOM are normal. Pupils are equal, round, and reactive to light.  Neck: Normal range of motion. Neck supple.  Cardiovascular: Normal rate, regular rhythm and normal  heart sounds.   Pulmonary/Chest: Effort normal and breath sounds normal.  Abdominal: Soft. Bowel sounds are normal. There is no tenderness.  Obese  Genitourinary:  External rectum normal.  Slightly tender at 6 o'clock to soft tissue surrounding rectum.  No obvious hemorrhoidal tissue.  Musculoskeletal: Normal range of motion.  Neurological: She is alert and oriented to person, place, and time.  Skin: Skin is warm and dry.  Psychiatric: She has a normal mood and affect.    ED Course  Procedures (including critical care time)  DIAGNOSTIC STUDIES: Oxygen Saturation is 97% on room air, normal by my interpretation.    COORDINATION OF CARE: 10:39 AM-Discussed treatment plan which includes pain medication and imaging with pt at bedside and pt agreed to plan.    Results for orders placed during the hospital encounter of 06/01/13  BASIC METABOLIC PANEL      Result Value Range   Sodium 139  135 - 145 mEq/L   Potassium 3.5  3.5 - 5.1 mEq/L   Chloride 103  96 - 112 mEq/L   CO2  26  19 - 32 mEq/L   Glucose, Bld 98  70 - 99 mg/dL   BUN 7  6 - 23 mg/dL   Creatinine, Ser 1.61  0.50 - 1.10 mg/dL   Calcium 9.0  8.4 - 09.6 mg/dL   GFR calc non Af Amer >90  >90 mL/min   GFR calc Af Amer >90  >90 mL/min  CBC WITH DIFFERENTIAL      Result Value Range   WBC 15.5 (*) 4.0 - 10.5 K/uL   RBC 4.00  3.87 - 5.11 MIL/uL   Hemoglobin 11.0 (*) 12.0 - 15.0 g/dL   HCT 04.5 (*) 40.9 - 81.1 %   MCV 85.8  78.0 - 100.0 fL   MCH 27.5  26.0 - 34.0 pg   MCHC 32.1  30.0 - 36.0 g/dL   RDW 91.4  78.2 - 95.6 %   Platelets 316  150 - 400 K/uL   Neutrophils Relative % 83 (*) 43 - 77 %   Neutro Abs 12.9 (*) 1.7 - 7.7 K/uL   Lymphocytes Relative 11 (*) 12 - 46 %   Lymphs Abs 1.7  0.7 - 4.0 K/uL   Monocytes Relative 5  3 - 12 %   Monocytes Absolute 0.8  0.1 - 1.0 K/uL   Eosinophils Relative 0  0 - 5 %   Eosinophils Absolute 0.1  0.0 - 0.7 K/uL   Basophils Relative 0  0 - 1 %   Basophils Absolute 0.0  0.0 - 0.1 K/uL  URINALYSIS, ROUTINE W REFLEX MICROSCOPIC      Result Value Range   Color, Urine YELLOW  YELLOW   APPearance HAZY (*) CLEAR   Specific Gravity, Urine 1.025  1.005 - 1.030   pH 6.0  5.0 - 8.0   Glucose, UA NEGATIVE  NEGATIVE mg/dL   Hgb urine dipstick LARGE (*) NEGATIVE   Bilirubin Urine NEGATIVE  NEGATIVE   Ketones, ur NEGATIVE  NEGATIVE mg/dL   Protein, ur NEGATIVE  NEGATIVE mg/dL   Urobilinogen, UA 0.2  0.0 - 1.0 mg/dL   Nitrite NEGATIVE  NEGATIVE   Leukocytes, UA NEGATIVE  NEGATIVE  URINE MICROSCOPIC-ADD ON      Result Value Range   Squamous Epithelial / LPF MANY (*) RARE   WBC, UA 21-50  <3 WBC/hpf   RBC / HPF 11-20  <3 RBC/hpf   Bacteria, UA MANY (*) RARE   Urine-Other  MUCOUS PRESENT      Ct Abdomen Pelvis W Contrast  06/01/2013   CLINICAL DATA:  Rectal pain. Evaluate for a perirectal abscess.  EXAM: CT ABDOMEN AND PELVIS WITH CONTRAST  TECHNIQUE: Multidetector CT imaging of the abdomen and pelvis was performed using the standard protocol following bolus  administration of intravenous contrast.  CONTRAST:  OMNIPAQUE IOHEXOL 300 MG/ML SOLN, 50mL OMNIPAQUE IOHEXOL 300 MG/ML SOLN  COMPARISON:  None.  FINDINGS: There is a heterogeneous collection along the left perineum contiguous with the anus. This is consistent with a perirectal abscess. It measures 6.7 cm x 4.4 cm in greatest transverse dimensions. It lies below the level of the levator sling. There is some hazy inflammatory type right stranding in the perirectal fat, but no other evidence of an abscess.  Clear lung bases. The heart is normal in size. The liver, spleen, gallbladder and pancreas are unremarkable. There is no bile duct dilation. The adrenal glands are normal in size.  Normal kidneys, ureters and bladder. The uterus and adnexa are unremarkable.  The colon and small bowel are unremarkable. A normal appendix is not discretely seen, but there is no evidence of appendicitis.  No pathologically enlarged lymph nodes.  No significant bony abnormality.  Lap band port lies in the left anterior upper abdomen in the deep subcutaneous soft tissues. The catheter is intact and the band portion is well positioned.  IMPRESSION: 1. Perianal abscess along the left aspect of the anus and perineum. This lies below the levator sling. 2. No abscess is seen above the levator sling. 3. Well positioned lap band. 4. No other abnormalities.   Electronically Signed   By: Amie Portland   On: 06/01/2013 12:45     MDM  No diagnosis found. CT scan reveals a 6.7 x 4.4 CM perirectal abscess.  Urinalysis shows 21-50 white cells. IV Piperacillin.   Admit to general surgery   I personally performed the services described in this documentation, which was scribed in my presence. The recorded information has been reviewed and is accurate.    Leslie Hutching, MD 06/01/13 1450

## 2013-06-02 ENCOUNTER — Inpatient Hospital Stay (HOSPITAL_COMMUNITY): Payer: Commercial Managed Care - PPO | Admitting: Anesthesiology

## 2013-06-02 ENCOUNTER — Encounter (HOSPITAL_COMMUNITY): Payer: Self-pay | Admitting: Anesthesiology

## 2013-06-02 ENCOUNTER — Encounter (HOSPITAL_COMMUNITY)
Admission: EM | Disposition: A | Discharge status: Home / Self Care | Payer: Self-pay | Source: Home / Self Care | Attending: General Surgery

## 2013-06-02 HISTORY — PX: INCISION AND DRAINAGE PERIRECTAL ABSCESS: SHX1804

## 2013-06-02 LAB — URINE CULTURE: Colony Count: NO GROWTH

## 2013-06-02 LAB — CBC
Hemoglobin: 10.2 g/dL — ABNORMAL LOW (ref 12.0–15.0)
MCH: 27.7 pg (ref 26.0–34.0)
Platelets: 293 10*3/uL (ref 150–400)
RBC: 3.68 MIL/uL — ABNORMAL LOW (ref 3.87–5.11)
RDW: 13.1 % (ref 11.5–15.5)
WBC: 9.8 10*3/uL (ref 4.0–10.5)

## 2013-06-02 LAB — BASIC METABOLIC PANEL
Calcium: 8.4 mg/dL (ref 8.4–10.5)
GFR calc non Af Amer: 76 mL/min — ABNORMAL LOW (ref >90)
Glucose, Bld: 89 mg/dL (ref 70–99)
Potassium: 3.4 mEq/L — ABNORMAL LOW (ref 3.5–5.1)
Sodium: 139 mEq/L (ref 135–145)

## 2013-06-02 SURGERY — INCISION AND DRAINAGE, ABSCESS, PERIRECTAL
Anesthesia: General | Wound class: Dirty or Infected

## 2013-06-02 MED ORDER — VALACYCLOVIR HCL 500 MG PO TABS
500.0000 mg | ORAL_TABLET | Freq: Every day | ORAL | Status: DC
  Administered 2013-06-02 – 2013-06-04 (×3): 500 mg via ORAL
  Filled 2013-06-02 (×8): qty 1

## 2013-06-02 MED ORDER — PROPOFOL 10 MG/ML IV BOLUS
INTRAVENOUS | Status: DC | PRN
  Administered 2013-06-02: 180 mg via INTRAVENOUS

## 2013-06-02 MED ORDER — DEXTROSE 5 % IV SOLN
INTRAVENOUS | Status: DC | PRN
  Administered 2013-06-02: 10:00:00 via INTRAVENOUS

## 2013-06-02 MED ORDER — LIDOCAINE HCL (PF) 1 % IJ SOLN
INTRAMUSCULAR | Status: AC
  Filled 2013-06-02: qty 5

## 2013-06-02 MED ORDER — FENTANYL CITRATE 0.05 MG/ML IJ SOLN
25.0000 ug | INTRAMUSCULAR | Status: DC
  Administered 2013-06-02 (×2): 25 ug via INTRAVENOUS
  Filled 2013-06-02: qty 2

## 2013-06-02 MED ORDER — ESCITALOPRAM OXALATE 10 MG PO TABS
20.0000 mg | ORAL_TABLET | Freq: Every day | ORAL | Status: DC
  Administered 2013-06-02 – 2013-06-03 (×2): 20 mg via ORAL
  Filled 2013-06-02 (×2): qty 2

## 2013-06-02 MED ORDER — MIDAZOLAM HCL 2 MG/2ML IJ SOLN
INTRAMUSCULAR | Status: AC
  Filled 2013-06-02: qty 2

## 2013-06-02 MED ORDER — MIDAZOLAM HCL 5 MG/5ML IJ SOLN
INTRAMUSCULAR | Status: DC | PRN
  Administered 2013-06-02: 2 mg via INTRAVENOUS

## 2013-06-02 MED ORDER — PROPOFOL 10 MG/ML IV EMUL
INTRAVENOUS | Status: AC
  Filled 2013-06-02: qty 20

## 2013-06-02 MED ORDER — SUCCINYLCHOLINE CHLORIDE 20 MG/ML IJ SOLN
INTRAMUSCULAR | Status: DC | PRN
  Administered 2013-06-02: 180 mg via INTRAVENOUS

## 2013-06-02 MED ORDER — HYDROCODONE-ACETAMINOPHEN 5-325 MG PO TABS
1.0000 | ORAL_TABLET | ORAL | Status: DC | PRN
  Administered 2013-06-02 (×3): 2 via ORAL
  Filled 2013-06-02 (×3): qty 2

## 2013-06-02 MED ORDER — OXYCODONE-ACETAMINOPHEN 5-325 MG PO TABS
1.0000 | ORAL_TABLET | ORAL | Status: DC | PRN
  Administered 2013-06-02 – 2013-06-03 (×2): 2 via ORAL
  Administered 2013-06-03: 1 via ORAL
  Administered 2013-06-03: 2 via ORAL
  Administered 2013-06-04 (×2): 1 via ORAL
  Filled 2013-06-02: qty 1
  Filled 2013-06-02 (×2): qty 2
  Filled 2013-06-02 (×2): qty 1
  Filled 2013-06-02: qty 2

## 2013-06-02 MED ORDER — SUCCINYLCHOLINE CHLORIDE 20 MG/ML IJ SOLN
INTRAMUSCULAR | Status: AC
  Filled 2013-06-02: qty 1

## 2013-06-02 MED ORDER — ROCURONIUM BROMIDE 50 MG/5ML IV SOLN
INTRAVENOUS | Status: AC
  Filled 2013-06-02: qty 1

## 2013-06-02 MED ORDER — LIDOCAINE HCL 1 % IJ SOLN
INTRAMUSCULAR | Status: DC | PRN
  Administered 2013-06-02: 50 mg via INTRADERMAL

## 2013-06-02 MED ORDER — CIPROFLOXACIN HCL 250 MG PO TABS
500.0000 mg | ORAL_TABLET | Freq: Two times a day (BID) | ORAL | Status: DC
  Administered 2013-06-02 – 2013-06-04 (×4): 500 mg via ORAL
  Filled 2013-06-02 (×4): qty 2

## 2013-06-02 MED ORDER — ONDANSETRON HCL 4 MG/2ML IJ SOLN
4.0000 mg | Freq: Once | INTRAMUSCULAR | Status: AC
  Administered 2013-06-02: 4 mg via INTRAVENOUS
  Filled 2013-06-02: qty 2

## 2013-06-02 MED ORDER — FENTANYL CITRATE 0.05 MG/ML IJ SOLN
INTRAMUSCULAR | Status: AC
  Filled 2013-06-02: qty 5

## 2013-06-02 MED ORDER — HYDROMORPHONE HCL PF 1 MG/ML IJ SOLN
1.0000 mg | INTRAMUSCULAR | Status: DC | PRN
  Administered 2013-06-02 (×2): 1 mg via INTRAVENOUS
  Filled 2013-06-02 (×2): qty 1

## 2013-06-02 MED ORDER — ZOLPIDEM TARTRATE 5 MG PO TABS
5.0000 mg | ORAL_TABLET | Freq: Every evening | ORAL | Status: DC | PRN
  Administered 2013-06-03 (×2): 5 mg via ORAL
  Filled 2013-06-02: qty 1

## 2013-06-02 MED ORDER — ROCURONIUM BROMIDE 100 MG/10ML IV SOLN
INTRAVENOUS | Status: DC | PRN
  Administered 2013-06-02: 25 mg via INTRAVENOUS

## 2013-06-02 MED ORDER — FENTANYL CITRATE 0.05 MG/ML IJ SOLN
INTRAMUSCULAR | Status: DC | PRN
  Administered 2013-06-02: 150 ug via INTRAVENOUS
  Administered 2013-06-02 (×2): 50 ug via INTRAVENOUS

## 2013-06-02 MED ORDER — LACTATED RINGERS IV SOLN
INTRAVENOUS | Status: DC
  Administered 2013-06-02: 09:00:00 via INTRAVENOUS

## 2013-06-02 MED ORDER — SODIUM CHLORIDE 0.9 % IR SOLN
Status: DC | PRN
  Administered 2013-06-02: 1000 mL

## 2013-06-02 MED ORDER — METRONIDAZOLE 500 MG PO TABS
500.0000 mg | ORAL_TABLET | Freq: Three times a day (TID) | ORAL | Status: DC
  Administered 2013-06-02 – 2013-06-04 (×6): 500 mg via ORAL
  Filled 2013-06-02 (×6): qty 1

## 2013-06-02 MED ORDER — BUPIVACAINE HCL (PF) 0.5 % IJ SOLN
INTRAMUSCULAR | Status: AC
  Filled 2013-06-02: qty 30

## 2013-06-02 MED ORDER — METOPROLOL TARTRATE 25 MG PO TABS: 25.0000 mg | ORAL_TABLET | Freq: Every day | ORAL | Status: DC | PRN

## 2013-06-02 MED ORDER — GLYCOPYRROLATE 0.2 MG/ML IJ SOLN
INTRAMUSCULAR | Status: DC | PRN
  Administered 2013-06-02: 0.4 mg via INTRAVENOUS

## 2013-06-02 MED ORDER — GLYCOPYRROLATE 0.2 MG/ML IJ SOLN
INTRAMUSCULAR | Status: AC
  Filled 2013-06-02: qty 2

## 2013-06-02 MED ORDER — NEOSTIGMINE METHYLSULFATE 1 MG/ML IJ SOLN
INTRAMUSCULAR | Status: DC | PRN
  Administered 2013-06-02 (×3): 1 mg via INTRAVENOUS

## 2013-06-02 MED ORDER — ONDANSETRON HCL 4 MG/2ML IJ SOLN: 4.0000 mg | Freq: Once | INTRAMUSCULAR | Status: DC | PRN

## 2013-06-02 MED ORDER — FENTANYL CITRATE 0.05 MG/ML IJ SOLN
INTRAMUSCULAR | Status: AC
  Filled 2013-06-02: qty 2

## 2013-06-02 MED ORDER — MIDAZOLAM HCL 2 MG/2ML IJ SOLN
1.0000 mg | INTRAMUSCULAR | Status: DC | PRN
Start: 2013-06-02 — End: 2013-06-02
  Administered 2013-06-02: 2 mg via INTRAVENOUS

## 2013-06-02 MED ORDER — FENTANYL CITRATE 0.05 MG/ML IJ SOLN
25.0000 ug | INTRAMUSCULAR | Status: DC | PRN
  Administered 2013-06-02 (×2): 50 ug via INTRAVENOUS

## 2013-06-02 MED ORDER — NEOSTIGMINE METHYLSULFATE 1 MG/ML IJ SOLN
INTRAMUSCULAR | Status: AC
  Filled 2013-06-02: qty 1

## 2013-06-02 MED ORDER — ENOXAPARIN SODIUM 40 MG/0.4ML ~~LOC~~ SOLN
SUBCUTANEOUS | Status: AC
  Filled 2013-06-02: qty 0.4

## 2013-06-02 MED FILL — Iohexol IV Soln 350 MG/ML: INTRAVENOUS | Qty: 100 | Status: AC

## 2013-06-02 SURGICAL SUPPLY — 19 items
BAG HAMPER (MISCELLANEOUS) ×2 IMPLANT
CLOTH BEACON ORANGE TIMEOUT ST (SAFETY) ×2 IMPLANT
COVER LIGHT HANDLE STERIS (MISCELLANEOUS) ×4 IMPLANT
ELECT REM PT RETURN 9FT ADLT (ELECTROSURGICAL) ×2
ELECTRODE REM PT RTRN 9FT ADLT (ELECTROSURGICAL) ×1 IMPLANT
GLOVE BIOGEL PI IND STRL 7.5 (GLOVE) ×1 IMPLANT
GLOVE BIOGEL PI INDICATOR 7.5 (GLOVE) ×2
GLOVE ECLIPSE 7.0 STRL STRAW (GLOVE) ×3 IMPLANT
GLOVE EXAM NITRILE LRG STRL (GLOVE) ×1 IMPLANT
GOWN STRL REIN XL XLG (GOWN DISPOSABLE) ×5 IMPLANT
KIT ROOM TURNOVER APOR (KITS) ×2 IMPLANT
MANIFOLD NEPTUNE II (INSTRUMENTS) ×2 IMPLANT
NS IRRIG 1000ML POUR BTL (IV SOLUTION) ×2 IMPLANT
PACK PERI GYN (CUSTOM PROCEDURE TRAY) ×1 IMPLANT
PAD ARMBOARD 7.5X6 YLW CONV (MISCELLANEOUS) ×2 IMPLANT
SET BASIN LINEN APH (SET/KITS/TRAYS/PACK) ×2 IMPLANT
SPONGE GAUZE 4X4 12PLY (GAUZE/BANDAGES/DRESSINGS) ×2 IMPLANT
SYR BULB IRRIGATION 50ML (SYRINGE) ×2 IMPLANT
SYR CONTROL 10ML LL (SYRINGE) ×2 IMPLANT

## 2013-06-02 NOTE — Transfer of Care (Signed)
Immediate Anesthesia Transfer of Care Note  Patient: Leslie Day  Procedure(s) Performed: Procedure(s): IRRIGATION AND DEBRIDEMENT PERIRECTAL ABSCESS (N/A)  Patient Location: PACU  Anesthesia Type:General  Level of Consciousness: awake and patient cooperative  Airway & Oxygen Therapy: Patient Spontanous Breathing and Patient connected to face mask oxygen  Post-op Assessment: Report given to PACU RN, Post -op Vital signs reviewed and stable and Patient moving all extremities  Post vital signs: Reviewed and stable  Complications: No apparent anesthesia complications

## 2013-06-02 NOTE — OR Nursing (Signed)
States takes lopressor only prn for palpitations last dose 1 week ago

## 2013-06-02 NOTE — Progress Notes (Signed)
Day of Surgery  Subjective: Pain persists. No change. No new questions.  Objective: Vital signs in last 24 hours: Temp:  [98.2 F (36.8 C)-100.3 F (37.9 C)] 98.2 F (36.8 C) (09/26 0500) Pulse Rate:  [64-80] 73 (09/26 0500) Resp:  [14-16] 15 (09/26 0500) BP: (102-138)/(41-73) 102/62 mmHg (09/26 0500) SpO2:  [94 %-98 %] 94 % (09/26 0500) Weight:  [136.079 kg (300 lb)] 136.079 kg (300 lb) (09/25 0947) Last BM Date: 06/01/13  Intake/Output from previous day: 09/25 0701 - 09/26 0700 In: 240 [P.O.:240] Out: -  Intake/Output this shift:    General appearance: alert and no distress GI: soft, non-tender; bowel sounds normal; no masses,  no organomegaly  Lab Results:   Recent Labs  06/01/13 1122 06/02/13 0454  WBC 15.5* 9.8  HGB 11.0* 10.2*  HCT 34.3* 31.7*  PLT 316 293   BMET  Recent Labs  06/01/13 1122 06/02/13 0454  NA 139 139  K 3.5 3.4*  CL 103 105  CO2 26 26  GLUCOSE 98 89  BUN 7 5*  CREATININE 0.79 0.91  CALCIUM 9.0 8.4   PT/INR No results found for this basename: LABPROT, INR,  in the last 72 hours ABG No results found for this basename: PHART, PCO2, PO2, HCO3,  in the last 72 hours  Studies/Results: Ct Abdomen Pelvis W Contrast  06/01/2013   CLINICAL DATA:  Rectal pain. Evaluate for a perirectal abscess.  EXAM: CT ABDOMEN AND PELVIS WITH CONTRAST  TECHNIQUE: Multidetector CT imaging of the abdomen and pelvis was performed using the standard protocol following bolus administration of intravenous contrast.  CONTRAST:  OMNIPAQUE IOHEXOL 300 MG/ML SOLN, 50mL OMNIPAQUE IOHEXOL 300 MG/ML SOLN  COMPARISON:  None.  FINDINGS: There is a heterogeneous collection along the left perineum contiguous with the anus. This is consistent with a perirectal abscess. It measures 6.7 cm x 4.4 cm in greatest transverse dimensions. It lies below the level of the levator sling. There is some hazy inflammatory type right stranding in the perirectal fat, but no other evidence  of an abscess.  Clear lung bases. The heart is normal in size. The liver, spleen, gallbladder and pancreas are unremarkable. There is no bile duct dilation. The adrenal glands are normal in size.  Normal kidneys, ureters and bladder. The uterus and adnexa are unremarkable.  The colon and small bowel are unremarkable. A normal appendix is not discretely seen, but there is no evidence of appendicitis.  No pathologically enlarged lymph nodes.  No significant bony abnormality.  Lap band port lies in the left anterior upper abdomen in the deep subcutaneous soft tissues. The catheter is intact and the band portion is well positioned.  IMPRESSION: 1. Perianal abscess along the left aspect of the anus and perineum. This lies below the levator sling. 2. No abscess is seen above the levator sling. 3. Well positioned lap band. 4. No other abnormalities.   Electronically Signed   By: Amie Portland   On: 06/01/2013 12:45    Anti-infectives: Anti-infectives   Start     Dose/Rate Route Frequency Ordered Stop   06/01/13 2200  [MAR Hold]  metroNIDAZOLE (FLAGYL) IVPB 500 mg     (On MAR Hold since 06/02/13 0825)   500 mg 100 mL/hr over 60 Minutes Intravenous Every 8 hours 06/01/13 2034     06/01/13 2100  [MAR Hold]  ciprofloxacin (CIPRO) IVPB 400 mg     (On MAR Hold since 06/02/13 0825)   400 mg 200 mL/hr over 60  Minutes Intravenous Every 12 hours 06/01/13 2034     06/01/13 1400  levofloxacin (LEVAQUIN) IVPB 750 mg  Status:  Discontinued     750 mg 100 mL/hr over 90 Minutes Intravenous Every 24 hours 06/01/13 1352 06/01/13 2034      Assessment/Plan: s/p Procedure(s): IRRIGATION AND DEBRIDEMENT PERIRECTAL ABSCESS (N/A) Perirectal abscess. Again discussed with the patient risks benefits alternatives of proceeding with incision and drainage. We'll proceed to the operating room for extensive incision and drainage of the perirectal abscess. Consent obtained.  LOS: 1 day    Haywood Meinders C 06/02/2013

## 2013-06-02 NOTE — Progress Notes (Signed)
Utilization Review Complete  

## 2013-06-02 NOTE — Anesthesia Postprocedure Evaluation (Signed)
  Anesthesia Post-op Note  Patient: Leslie Day  Procedure(s) Performed: Procedure(s): IRRIGATION AND DEBRIDEMENT PERIRECTAL ABSCESS (N/A)  Patient Location: PACU  Anesthesia Type:General  Level of Consciousness: awake, alert , oriented and patient cooperative  Airway and Oxygen Therapy: Patient Spontanous Breathing  Post-op Pain: 3 /10, mild  Post-op Assessment: Post-op Vital signs reviewed, Patient's Cardiovascular Status Stable, Respiratory Function Stable, Patent Airway, No signs of Nausea or vomiting and Pain level controlled  Post-op Vital Signs: Reviewed and stable  Complications: No apparent anesthesia complications

## 2013-06-02 NOTE — Care Management Note (Signed)
    Page 1 of 1   06/02/2013     1:21:58 PM   CARE MANAGEMENT NOTE 06/02/2013  Patient:  Leslie Day, Leslie Day   Account Number:  [id number]  Date Initiated:  06/02/2013  Documentation initiated by:  Rosemary Holms  Subjective/Objective Assessment:   Pt lives at home with her boyfriend. CM spoke with BF who stated that he will provide any wound care assistance she iwll need. CM stated if he changes his mind, let her or RN know and we can facilitate HH.     Action/Plan:   Anticipated DC Date:  06/03/2013   Anticipated DC Plan:  HOME/SELF CARE      DC Planning Services  CM consult      Choice offered to / List presented to:             Status of service:  Completed, signed off Medicare Important Message given?   (If response is "NO", the following Medicare IM given date fields will be blank) Date Medicare IM given:   Date Additional Medicare IM given:    Discharge Disposition:    Per UR Regulation:    If discussed at Long Length of Stay Meetings, dates discussed:    Comments:  06/02/13 Rosemary Holms RN BSN CM

## 2013-06-02 NOTE — OR Nursing (Signed)
Up to bathroom to void 

## 2013-06-02 NOTE — Anesthesia Procedure Notes (Signed)
Procedure Name: Intubation Date/Time: 06/02/2013 10:11 AM Performed by: Despina Hidden Pre-anesthesia Checklist: Patient being monitored, Suction available, Emergency Drugs available and Patient identified Patient Re-evaluated:Patient Re-evaluated prior to inductionOxygen Delivery Method: Circle system utilized Preoxygenation: Pre-oxygenation with 100% oxygen Intubation Type: IV induction, Rapid sequence and Cricoid Pressure applied Ventilation: Mask ventilation without difficulty and Oral airway inserted - appropriate to patient size Laryngoscope Size: Mac and 3 Grade View: Grade I Tube type: Oral Tube size: 7.0 mm Number of attempts: 1 Airway Equipment and Method: Stylet Placement Confirmation: ETT inserted through vocal cords under direct vision,  positive ETCO2 and breath sounds checked- equal and bilateral Secured at: 22 cm Tube secured with: Tape Dental Injury: Teeth and Oropharynx as per pre-operative assessment

## 2013-06-02 NOTE — Anesthesia Preprocedure Evaluation (Signed)
Anesthesia Evaluation  Patient identified by MRN, date of birth, ID band Patient awake    Reviewed: Allergy & Precautions, H&P , NPO status , Patient's Chart, lab work & pertinent test results  Airway Mallampati: I TM Distance: >3 FB Neck ROM: Full    Dental  (+) Teeth Intact   Pulmonary shortness of breath and with exertion,  breath sounds clear to auscultation        Cardiovascular Rhythm:Regular Rate:Normal     Neuro/Psych PSYCHIATRIC DISORDERS Anxiety Depression    GI/Hepatic GERD-  Medicated and Controlled,(+)     substance abuse  marijuana use,   Endo/Other  Morbid obesity  Renal/GU      Musculoskeletal   Abdominal (+) + obese,   Peds  Hematology   Anesthesia Other Findings   Reproductive/Obstetrics                           Anesthesia Physical Anesthesia Plan  ASA: II  Anesthesia Plan: General   Post-op Pain Management:    Induction: Intravenous, Rapid sequence and Cricoid pressure planned  Airway Management Planned: Oral ETT  Additional Equipment:   Intra-op Plan:   Post-operative Plan: Extubation in OR  Informed Consent: I have reviewed the patients History and Physical, chart, labs and discussed the procedure including the risks, benefits and alternatives for the proposed anesthesia with the patient or authorized representative who has indicated his/her understanding and acceptance.     Plan Discussed with:   Anesthesia Plan Comments:         Anesthesia Quick Evaluation

## 2013-06-02 NOTE — Op Note (Signed)
Patient:  Leslie Day  DOB:  [birth year]  MRN:  [id number]   Preop Diagnosis:  Complex/deep perirectal abscess  Postop Diagnosis:  The same  Procedure:  Incision and drainage of deep perirectal abscess  Surgeon:  Dr. Tilford Pillar  Anes:  General endotracheal  Indications:  Patient is a 42 year old female presented to [facility] with severe perirectal pain. Workup and evaluation in the emergency department was consistent for deep perirectal abscess. Due to the patient's morbid obesity as well as the location of the abscess it was advised patient undergo an intraoperative drainage. Risks benefits alternatives incision and drainage were discussed. Her questions and concerns are addressed the patient as consented for the planned procedure.  Procedure note:  Patient is taken to the operating room was placed in supine position the or table time the general anesthetic is administered. Once patient was asleep she is in endotracheally intubated by the nurse anesthetist. At this point her perineum and rectum were prepped with Betadine solution and draped in standard fashion. Time out was performed. A digital rectal exam there is a boggy area noted on the left lateral aspect of the rectum. No palpable masses apparent. A rectal speculum was inserted. Upon performing the digital rectal exam I did enter into the abscess cavity. A large amount of copious purulent material was encountered. Inspection after the drainage demonstrates no evidence of any additional areas of fluctuance. No overlying skin lesions noted. No apparent tracking or fistula.  At this point the procedure is concluded. The patient was allowed to come out of general anesthetic and stretcher back to the PACU in stable condition. At the conclusion of procedure all instrument, sponge, needle counts are correct. Patient tolerated procedure extremely well the  Complications:  None apparent  EBL:  Minimal  Specimen:   none

## 2013-06-03 MED ORDER — PANTOPRAZOLE SODIUM 40 MG PO TBEC
40.0000 mg | DELAYED_RELEASE_TABLET | Freq: Every day | ORAL | Status: DC
  Administered 2013-06-03: 40 mg via ORAL
  Filled 2013-06-03: qty 1

## 2013-06-03 NOTE — Progress Notes (Signed)
1 Day Post-Op  Subjective: Pain still present but somewhat better. She denies any fevers or chills. No nausea. Tolerating diet  Objective: Vital signs in last 24 hours: Temp:  [97.9 F (36.6 C)-98.5 F (36.9 C)] 97.9 F (36.6 C) (09/27 1356) Pulse Rate:  [62-70] 63 (09/27 1356) Resp:  [16-18] 18 (09/27 1356) BP: (130-135)/(68-73) 130/68 mmHg (09/27 1356) SpO2:  [95 %-100 %] 98 % (09/27 1356) Last BM Date: 06/01/13  Intake/Output from previous day: 09/26 0701 - 09/27 0700 In: 1380 [P.O.:480; I.V.:900] Out: -  Intake/Output this shift:    General appearance: alert and no distress GI: soft, non-tender; bowel sounds normal; no masses,  no organomegaly  Lab Results:   Recent Labs  06/01/13 1122 06/02/13 0454  WBC 15.5* 9.8  HGB 11.0* 10.2*  HCT 34.3* 31.7*  PLT 316 293   BMET  Recent Labs  06/01/13 1122 06/02/13 0454  NA 139 139  K 3.5 3.4*  CL 103 105  CO2 26 26  GLUCOSE 98 89  BUN 7 5*  CREATININE 0.79 0.91  CALCIUM 9.0 8.4   PT/INR No results found for this basename: LABPROT, INR,  in the last 72 hours ABG No results found for this basename: PHART, PCO2, PO2, HCO3,  in the last 72 hours  Studies/Results: No results found.  Anti-infectives: Anti-infectives   Start     Dose/Rate Route Frequency Ordered Stop   06/02/13 2215  ciprofloxacin (CIPRO) tablet 500 mg     500 mg Oral 2 times daily 06/02/13 2203     06/02/13 2215  metroNIDAZOLE (FLAGYL) tablet 500 mg     500 mg Oral 3 times per day 06/02/13 2203     06/02/13 1400  valACYclovir (VALTREX) tablet 500 mg     500 mg Oral Daily 06/02/13 1208     06/01/13 2200  metroNIDAZOLE (FLAGYL) IVPB 500 mg  Status:  Discontinued     500 mg 100 mL/hr over 60 Minutes Intravenous Every 8 hours 06/01/13 2034 06/02/13 2203   06/01/13 2100  ciprofloxacin (CIPRO) IVPB 400 mg  Status:  Discontinued     400 mg 200 mL/hr over 60 Minutes Intravenous Every 12 hours 06/01/13 2034 06/02/13 2203   06/01/13 1400   levofloxacin (LEVAQUIN) IVPB 750 mg  Status:  Discontinued     750 mg 100 mL/hr over 90 Minutes Intravenous Every 24 hours 06/01/13 1352 06/01/13 2034      Assessment/Plan: s/p Procedure(s): IRRIGATION AND DEBRIDEMENT PERIRECTAL ABSCESS (N/A) Perirectal abscess. Continue oral antibiotics. Continue to monitor closely over the next 24 hours. Her pain continues to improve and she feels well tomorrow we'll plan to discharge patient tomorrow.  LOS: 2 days    Sakira Dahmer C 06/03/2013

## 2013-06-03 NOTE — Anesthesia Postprocedure Evaluation (Signed)
  Anesthesia Post-op Note  Patient: Leslie Day  Procedure(s) Performed: Procedure(s): IRRIGATION AND DEBRIDEMENT PERIRECTAL ABSCESS (N/A)  Patient Location: room 331  Anesthesia Type:General  Level of Consciousness: awake, alert , oriented and patient cooperative  Airway and Oxygen Therapy: Patient Spontanous Breathing  Post-op Pain: 2 /10, mild  Post-op Assessment: Post-op Vital signs reviewed, Patient's Cardiovascular Status Stable, Respiratory Function Stable, Patent Airway, No signs of Nausea or vomiting, Adequate PO intake and Pain level controlled  Post-op Vital Signs: Reviewed and stable  Complications: No apparent anesthesia complications

## 2013-06-03 NOTE — Progress Notes (Signed)
The patient is receiving Protonix by the intravenous route.  Based on criteria approved by the Pharmacy and Therapeutics Committee and the Medical Executive Committee, the medication is being converted to the equivalent oral dose form.  These criteria include: -No Active GI bleeding -Able to tolerate diet of full liquids (or better) or tube feeding OR able to tolerate other medications by the oral or enteral route  If you have any questions about this conversion, please contact the Pharmacy Department (ext [phone]).  Thank you.  Mady Gemma, [facility] 06/03/2013 10:53 AM

## 2013-06-03 NOTE — Progress Notes (Signed)
Pt lost IV access. Three RNs attempted to restart including the [facility] RN. MD notified that access was lost. He ordered that the antibiotics be switched to PO and pain medication be switched to percocet PO. Will continue to monitor pt for pain control.

## 2013-06-04 MED ORDER — METRONIDAZOLE 500 MG PO TABS: 500.0000 mg | ORAL_TABLET | Freq: Three times a day (TID) | ORAL | Status: AC

## 2013-06-04 MED ORDER — HYDROCODONE-ACETAMINOPHEN 5-325 MG PO TABS: 1.0000 | ORAL_TABLET | ORAL | Status: AC | PRN

## 2013-06-04 MED ORDER — CIPROFLOXACIN HCL 500 MG PO TABS: 500.0000 mg | ORAL_TABLET | Freq: Two times a day (BID) | ORAL | Status: AC

## 2013-06-04 MED ORDER — ONDANSETRON HCL 4 MG PO TABS
4.0000 mg | ORAL_TABLET | Freq: Four times a day (QID) | ORAL | Status: DC | PRN
  Administered 2013-06-04: 4 mg via ORAL
  Filled 2013-06-04: qty 1

## 2013-06-04 NOTE — Progress Notes (Signed)
Pt was discharged with instructions and prescriptions she verbalized understanding.  She left the floor via w/c with staff and family in stable condition.

## 2013-06-05 ENCOUNTER — Encounter (HOSPITAL_COMMUNITY): Payer: Self-pay | Admitting: General Surgery

## 2013-06-05 NOTE — H&P (Signed)
Leslie Day is an 42 y.o. female.   Chief Complaint: Perirectal pain HPI: Patient presented to [facility] with several days of increasing perirectal pain. She had been prescribed a steroid suppository with no significant improvement. She denies any drainage. No recent history constipation or diarrhea. She has had some subjective chills but no fevers. No abdominal pain or discomfort. No similar symptomatology in the past. No history of Crohn's or ulcerative colitis with in her history or patient's family history. Patient has had previous colonoscopy with biopsies however no evidence of any history of colitis or inflammatory processes. No history of colon cancer.  Past Medical History  Diagnosis Date  . Depression   . Palpitation   . Exertional shortness of breath   . GERD (gastroesophageal reflux disease)   . Anxiety     Past Surgical History  Procedure Laterality Date  . Laparoscopic gastric banding  2010  . Incision and drainage perirectal abscess N/A 06/02/2013    Procedure: IRRIGATION AND DEBRIDEMENT PERIRECTAL ABSCESS;  Surgeon: Fabio Bering, MD;  Location: [facility];  Service: General;  Laterality: N/A;    History reviewed. No pertinent family history. Social History:  reports that she has never smoked. She has never used smokeless tobacco. She reports that she uses illicit drugs (Marijuana and Other-see comments). She reports that she does not drink alcohol.  Allergies:  Allergies  Allergen Reactions  . Ativan [Lorazepam] Other (See Comments)    Pt states medication gave her suicidal and homicidal ideations. Was told by MD to stop taking it.   Marland Kitchen Penicillins     Rash     No prescriptions prior to admission    No results found for this or any previous visit (from the past 48 hour(s)). No results found.  Review of Systems  Constitutional: Positive for chills.  HENT: Negative.   Eyes: Negative.   Respiratory: Negative.   Cardiovascular: Negative.    Gastrointestinal: Positive for nausea. Negative for vomiting, abdominal pain, diarrhea, constipation, blood in stool and melena.       Perirectal pain  Genitourinary: Negative.   Musculoskeletal: Negative.   Skin: Negative.   Neurological: Negative.   Endo/Heme/Allergies: Negative.   Psychiatric/Behavioral: Negative.     Blood pressure 171/92, pulse 88, temperature 98.3 F (36.8 C), temperature source Oral, resp. rate 18, height 5\' 7"  (1.702 m), weight 136.079 kg (300 lb), last menstrual period 05/22/2013, SpO2 98.00%. Physical Exam  Constitutional: She appears well-developed and well-nourished. No distress.  Morbidly obese  HENT:  Head: Normocephalic and atraumatic.  Eyes: Conjunctivae and EOM are normal. Pupils are equal, round, and reactive to light. No scleral icterus.  Neck: Normal range of motion. No tracheal deviation present. No thyromegaly present.  Cardiovascular: Normal rate, regular rhythm and normal heart sounds.   Respiratory: Effort normal and breath sounds normal.  GI: Soft. Bowel sounds are normal. She exhibits no distension and no mass. There is no tenderness. There is no rebound.  Extensive perirectal pain  Lymphadenopathy:    She has no cervical adenopathy.  Neurological: She is alert.  Skin: Skin is warm and dry.     Assessment/Plan Complex perirectal abscess. Due to the depth and patient's discomfort I have discussed with the patient proceeding to the operating room for incision and drainage. Risks benefits alternatives of operative incision and drainage and discussed with the patient. At this point she'll be continued on n.p.o. diet after midnight. Continue IV fluid hydration. Continued on IV antibiotics. DVT prophylaxis. Patient  understands and is agreeable to planned procedure and treatment course.  Mahkayla Preece C 06/05/2013, 12:42 PM

## 2013-06-05 NOTE — Addendum Note (Signed)
Addendum created 06/05/13 1610 by Despina Hidden, CRNA   Modules edited: Anesthesia Medication Administration

## 2013-06-05 NOTE — Discharge Summary (Signed)
Physician Discharge Summary  Patient ID: Leslie Day MRN: [id number] DOB/AGE: [birth year] 42 y.o.  Admit date: 06/01/2013 Discharge date: 06/04/2013  Admission Diagnoses: Perirectal abscess  Discharge Diagnoses: The same Active Problems:   * No active hospital problems. *   Discharged Condition: stable  Hospital Course: Patient presented to [facility] with perirectal pain. Workup and evaluation was consistent for a deep complex pararectal abscess. She was started on IV antibiotics. Taken to the operating room the subsequent day for incision and drainage. She tolerated this extremely well. Her pain continued to improve. She was continued on IV antibiotics over the next subsequent 24 hours. With improvement of her symptomatology she was transitioned to oral antibiotics. Tolerating regular diet with normal bowel function she was made ready for discharge.  Consults: None  Significant Diagnostic Studies: labs: Daily  Treatments: surgery: Incision and drainage of perirectal abscess  Discharge Exam: Blood pressure 171/92, pulse 88, temperature 98.3 F (36.8 C), temperature source Oral, resp. rate 18, height 5\' 7"  (1.702 m), weight 136.079 kg (300 lb), last menstrual period 05/22/2013, SpO2 98.00%. General appearance: alert and no distress Resp: clear to auscultation bilaterally Cardio: regular rate and rhythm GI: soft, non-tender; bowel sounds normal; no masses,  no organomegaly and Still with some perirectal tenderness. No significant discharge. No surrounding erythema  Disposition: 01-Home or Self Care  Discharge Orders   Future Orders Complete By Expires   Call MD for:  persistant nausea and vomiting  As directed    Call MD for:  severe uncontrolled pain  As directed    Diet - low sodium heart healthy  As directed    Discharge instructions  As directed    Comments:     Increase activity as tolerated. May place ice pack for comfort.  Alternate an anti-inflammatory  such as ibuprofen (Motrin, Advil) 400-600mg  every 6 hours with the prescribed pain medication.   Do not take any additional acetaminophen as there is Tylenol in the pain medication.   Discharge wound care:  As directed    Comments:     Warm soaks if improve symptoms. Stool softener.   Driving Restrictions  As directed    Comments:     No driving while on pain medications.   Increase activity slowly  As directed        Medication List    STOP taking these medications       hydrocortisone 2.5 % rectal cream  Commonly known as:  ANUSOL-HC      TAKE these medications       ciprofloxacin 500 MG tablet  Commonly known as:  CIPRO  Take 1 tablet (500 mg total) by mouth 2 (two) times daily.     escitalopram 20 MG tablet  Commonly known as:  LEXAPRO  Take 20 mg by mouth at bedtime.     HYDROcodone-acetaminophen 5-325 MG per tablet  Commonly known as:  NORCO/VICODIN  Take 1-2 tablets by mouth every 4 (four) hours as needed for pain.     metoprolol tartrate 25 MG tablet  Commonly known as:  LOPRESSOR  Take 25 mg by mouth daily as needed (for palpitations).     metroNIDAZOLE 500 MG tablet  Commonly known as:  FLAGYL  Take 1 tablet (500 mg total) by mouth every 8 (eight) hours.     valACYclovir 500 MG tablet  Commonly known as:  VALTREX  Take 500 mg by mouth daily.     zolpidem 12.5 MG CR tablet  Commonly known  as:  AMBIEN CR  Take 12.5 mg by mouth at bedtime as needed for sleep.         SignedFabio Bering 06/05/2013, 12:46 PM

## 2014-10-24 ENCOUNTER — Encounter (HOSPITAL_COMMUNITY): Payer: Self-pay | Admitting: *Deleted

## 2014-10-24 ENCOUNTER — Emergency Department (HOSPITAL_COMMUNITY)
Admission: EM | Admit: 2014-10-24 | Discharge: 2014-10-24 | Disposition: A | Discharge status: Home / Self Care | Payer: Commercial Managed Care - PPO | Attending: Emergency Medicine | Admitting: Emergency Medicine

## 2014-10-24 ENCOUNTER — Emergency Department (HOSPITAL_COMMUNITY): Payer: Commercial Managed Care - PPO

## 2014-10-24 DIAGNOSIS — F419 Anxiety disorder, unspecified: Secondary | ICD-10-CM | POA: Insufficient documentation

## 2014-10-24 DIAGNOSIS — Z3202 Encounter for pregnancy test, result negative: Secondary | ICD-10-CM | POA: Insufficient documentation

## 2014-10-24 DIAGNOSIS — Z792 Long term (current) use of antibiotics: Secondary | ICD-10-CM | POA: Insufficient documentation

## 2014-10-24 DIAGNOSIS — Z79899 Other long term (current) drug therapy: Secondary | ICD-10-CM | POA: Insufficient documentation

## 2014-10-24 DIAGNOSIS — Z880 Allergy status to penicillin: Secondary | ICD-10-CM | POA: Insufficient documentation

## 2014-10-24 DIAGNOSIS — F1310 Sedative, hypnotic or anxiolytic abuse, uncomplicated: Secondary | ICD-10-CM | POA: Insufficient documentation

## 2014-10-24 DIAGNOSIS — E669 Obesity, unspecified: Secondary | ICD-10-CM | POA: Insufficient documentation

## 2014-10-24 DIAGNOSIS — Z8719 Personal history of other diseases of the digestive system: Secondary | ICD-10-CM | POA: Insufficient documentation

## 2014-10-24 DIAGNOSIS — R002 Palpitations: Secondary | ICD-10-CM

## 2014-10-24 DIAGNOSIS — F329 Major depressive disorder, single episode, unspecified: Secondary | ICD-10-CM | POA: Insufficient documentation

## 2014-10-24 LAB — I-STAT TROPONIN, ED: TROPONIN I, POC: 0 ng/mL (ref 0.00–0.08)

## 2014-10-24 LAB — BASIC METABOLIC PANEL
ANION GAP: 5 (ref 5–15)
BUN: 8 mg/dL (ref 6–23)
CALCIUM: 8.5 mg/dL (ref 8.4–10.5)
CO2: 27 mmol/L (ref 19–32)
Chloride: 108 mmol/L (ref 96–112)
Creatinine, Ser: 0.89 mg/dL (ref 0.50–1.10)
GFR, EST NON AFRICAN AMERICAN: 78 mL/min — AB (ref >90)
Glucose, Bld: 105 mg/dL — ABNORMAL HIGH (ref 70–99)
Potassium: 3.3 mmol/L — ABNORMAL LOW (ref 3.5–5.1)
SODIUM: 140 mmol/L (ref 135–145)

## 2014-10-24 LAB — URINALYSIS, ROUTINE W REFLEX MICROSCOPIC
Bilirubin Urine: NEGATIVE
Glucose, UA: NEGATIVE mg/dL
Ketones, ur: NEGATIVE mg/dL
LEUKOCYTES UA: NEGATIVE
Nitrite: NEGATIVE
PH: 6.5 (ref 5.0–8.0)
PROTEIN: NEGATIVE mg/dL
Specific Gravity, Urine: 1.012 (ref 1.005–1.030)
Urobilinogen, UA: 0.2 mg/dL (ref 0.0–1.0)

## 2014-10-24 LAB — CBC
HEMATOCRIT: 38.6 % (ref 36.0–46.0)
HEMOGLOBIN: 12.4 g/dL (ref 12.0–15.0)
MCH: 27.1 pg (ref 26.0–34.0)
MCHC: 32.1 g/dL (ref 30.0–36.0)
MCV: 84.5 fL (ref 78.0–100.0)
Platelets: 312 10*3/uL (ref 150–400)
RBC: 4.57 MIL/uL (ref 3.87–5.11)
RDW: 12.9 % (ref 11.5–15.5)
WBC: 6 10*3/uL (ref 4.0–10.5)

## 2014-10-24 LAB — RAPID URINE DRUG SCREEN, HOSP PERFORMED
AMPHETAMINES: NOT DETECTED
BENZODIAZEPINES: POSITIVE — AB
Barbiturates: NOT DETECTED
Cocaine: NOT DETECTED
OPIATES: NOT DETECTED
Tetrahydrocannabinol: NOT DETECTED

## 2014-10-24 LAB — URINE MICROSCOPIC-ADD ON

## 2014-10-24 LAB — POC URINE PREG, ED: Preg Test, Ur: NEGATIVE

## 2014-10-24 LAB — D-DIMER, QUANTITATIVE (NOT AT ARMC): D DIMER QUANT: 0.37 ug{FEU}/mL (ref 0.00–0.48)

## 2014-10-24 LAB — BRAIN NATRIURETIC PEPTIDE: B Natriuretic Peptide: 19.5 pg/mL (ref 0.0–100.0)

## 2014-10-24 NOTE — ED Notes (Signed)
Leslie HatchetSheila in main lab will add on d-dimer to previously drawn lab work,

## 2014-10-24 NOTE — ED Notes (Signed)
Patient transported to X-ray 

## 2014-10-24 NOTE — ED Notes (Signed)
Pt ambulated with stand by assist to bathroom, oxygen saturations noted to be 100%. Pt denies any dizziness or weakness. NAD noted. PA, Bowie notified.

## 2014-10-24 NOTE — ED Notes (Signed)
PA at bedside.

## 2014-10-24 NOTE — ED Notes (Signed)
Pt arrived by [facility]. Pt reported having fatigue and palpitations this am. Was at work and they reported pt in sinus tach with runs of vtach. Per ems, pts HR in the 80's with occ pvc. No acute distress noted on arrival, denies cp.

## 2014-10-24 NOTE — ED Provider Notes (Signed)
CSN: [id number]     Arrival date & time 10/24/14  1021 History   First MD Initiated Contact with Patient 10/24/14 1022     Chief Complaint  Patient presents with  . Palpitations     (Consider location/radiation/quality/duration/timing/severity/associated sxs/prior Treatment) HPI   44 year old female with history of palpitation, anxiety, depression, GERD was brought here via EMS from work for evaluation of heart palpitation. Patient admits that she has history of recurrent heart palpitation usually happen several times a week which she will take Lopressor or clorazepate as needed for her heart palpitation. Last night she experiencing a bout of heart palpitation before going to bed which she took a dose of clorazepate and symptom resolved she went to sleep without any difficulty, this morning she went to walk as normal however she once again expressing her heart palpitation with associated shortness of breath which is new. Symptoms concerning to her therefore EMS was contacted. They recommend patient to take a clorazepate which she took prior to arrival and now symptom has resolved. She denies having any fever, lightheadedness, dizziness, chest pain, diaphoresis, productive cough, abdominal pain, nausea vomiting diarrhea. She admits that she has been a bit stressed out moving into a new house. Denies consuming caffeine, using street drugs, alcohol, change in medication, any recent infection, or feeling dehydrated. She works at a doctor's office and during her heart palpitation she reported having shortness of breath and her oxygen was in the 80s therefore she was sent here to the ER for further evaluation. It was noted that she did have some mild chest discomfort but that has since resolved. She denies any prior history of PE or DVT however currently on oral birth control. Denies any calf pain but reports bilateral ankle swelling and foot muscle cramp from prolonged standing.  Past Medical History   Diagnosis Date  . Depression   . Palpitation   . Exertional shortness of breath   . GERD (gastroesophageal reflux disease)   . Anxiety    Past Surgical History  Procedure Laterality Date  . Laparoscopic gastric banding  2010  . Incision and drainage perirectal abscess N/A 06/02/2013    Procedure: IRRIGATION AND DEBRIDEMENT PERIRECTAL ABSCESS;  Surgeon: Fabio Bering, MD;  Location: [facility];  Service: General;  Laterality: N/A;   History reviewed. No pertinent family history. History  Substance Use Topics  . Smoking status: Never Smoker   . Smokeless tobacco: Never Used  . Alcohol Use: No   OB History    No data available     Review of Systems  All other systems reviewed and are negative.     Allergies  Ativan and Penicillins  Home Medications   Prior to Admission medications   Medication Sig Start Date End Date Taking? Authorizing Provider  ciprofloxacin (CIPRO) 500 MG tablet Take 1 tablet (500 mg total) by mouth 2 (two) times daily. 06/04/13   Fabio Bering, MD  escitalopram (LEXAPRO) 20 MG tablet Take 20 mg by mouth at bedtime.     Historical Provider, MD  HYDROcodone-acetaminophen (NORCO/VICODIN) 5-325 MG per tablet Take 1-2 tablets by mouth every 4 (four) hours as needed for pain. 06/04/13   Fabio Bering, MD  metoprolol tartrate (LOPRESSOR) 25 MG tablet Take 25 mg by mouth daily as needed (for palpitations).    Historical Provider, MD  metroNIDAZOLE (FLAGYL) 500 MG tablet Take 1 tablet (500 mg total) by mouth every 8 (eight) hours. 06/04/13   Fabio Bering, MD  valACYclovir Ralph Dowdy)  500 MG tablet Take 500 mg by mouth daily.     Historical Provider, MD  zolpidem (AMBIEN CR) 12.5 MG CR tablet Take 12.5 mg by mouth at bedtime as needed for sleep.    Historical Provider, MD   BP 113/67 mmHg  Pulse 57  Temp(Src) 98 F (36.7 C) (Oral)  Resp 20  Ht  (1.676 m)  Wt 307 lb (139.254 kg)  BMI 49.57 kg/m2  LMP 10/22/2014 Physical Exam  Constitutional: She  appears well-developed and well-nourished. No distress.  Moderately obese African-American female appears to be in no acute distress, nontoxic in appearance.  HENT:  Head: Atraumatic.  Mouth/Throat: Oropharynx is clear and moist.  Eyes: Conjunctivae are normal.  Neck: Neck supple.  Cardiovascular: Normal rate, regular rhythm and intact distal pulses.   Pulmonary/Chest: Effort normal and breath sounds normal. No respiratory distress. She has no wheezes. She has no rales. She exhibits no tenderness.  Abdominal: Soft. There is no tenderness.  Musculoskeletal: She exhibits no edema or tenderness.  Neurological: She is alert.  Skin: No rash noted.  Psychiatric: She has a normal mood and affect.  Nursing note and vitals reviewed.   ED Course  Procedures (including critical care time)  Patient presents with current heart palpitation that has since resolved. She is currently asymptomatic. She is hemodynamically stable. Symptoms likely secondary to anxiety and stress. Since patient reported having a bouts of shortness of breath with heart palpitation, and was hypoxic transiently, i will obtain a d-dimer in the setting of birth control use.  Work up initiated, care discussed with Dr. Elesa Massed.    Differential diagnosis including PE, caffeine abuse, cardiac arrhythmia, thyroid disease, drug-induced, dehydration.  1:42 PM Patient is currently symptom free, normal orthostatic vital sign, no hypoxia with ambulation, pregnancy test is negative, UA shows no evidence of urinary tract infection, blood work, mostly reassuring, d-dimer is negative, chest x-ray unremarkable, and a normal EKG. Patient was given reassurance with recommendation to follow-up closely with PCP for further evaluation of her condition.  2:12 PM Repeat ECG shows no significant arrhythmia.  Pt will f/u with her PCP for further care.  Cardiology referral given as needed.    Labs Review Labs Reviewed  BASIC METABOLIC PANEL - Abnormal;  Notable for the following:    Potassium 3.3 (*)    Glucose, Bld 105 (*)    GFR calc non Af Amer 78 (*)    All other components within normal limits  URINALYSIS, ROUTINE W REFLEX MICROSCOPIC - Abnormal; Notable for the following:    Hgb urine dipstick SMALL (*)    All other components within normal limits  URINE RAPID DRUG SCREEN (HOSP PERFORMED) - Abnormal; Notable for the following:    Benzodiazepines POSITIVE (*)    All other components within normal limits  CBC  BRAIN NATRIURETIC PEPTIDE  D-DIMER, QUANTITATIVE  URINE MICROSCOPIC-ADD ON  Rosezena Sensor, ED  POC URINE PREG, ED    Imaging Review Dg Chest 2 View  10/24/2014   CLINICAL DATA:  Palpitations starting last night, shortness of Breath  EXAM: CHEST  2 VIEW  COMPARISON:  04/24/2013  FINDINGS: Cardiomediastinal silhouette is stable. No acute infiltrate or pulmonary edema. Minimal degenerative changes mid thoracic spine.  IMPRESSION: No active cardiopulmonary disease.   Electronically Signed   By: Natasha Mead M.D.   On: 10/24/2014 11:14     EKG Interpretation   Date/Time:  Wednesday October 24 2014 10:35:25 EST Ventricular Rate:  68 PR Interval:  154 QRS  Duration: 83 QT Interval:  394 QTC Calculation: 419 R Axis:   21 Text Interpretation:  Sinus rhythm Confirmed by WARD,  DO, KRISTEN ([id number])  on 10/24/2014 10:42:14 AM      MDM   Final diagnoses:  Palpitation    BP 157/81 mmHg  Pulse 83  Temp(Src) 98 F (36.7 C) (Oral)  Resp 20  Ht 5\' 6"  (1.676 m)  Wt 307 lb (139.254 kg)  BMI 49.57 kg/m2  SpO2 97%  LMP 10/22/2014  I have reviewed nursing notes and vital signs. I personally reviewed the imaging tests through PACS system  I reviewed available ER/hospitalization records thought the EMR     Fayrene HelperBowie Amey Hossain, PA-C 10/24/14 1413  Kristen N Ward, DO 10/24/14 1427

## 2014-10-24 NOTE — Discharge Instructions (Signed)
Please follow up with your doctor for further evaluation of your heart palpitation.  Return if you develop chest pain, lightheadedness, or shortness of breath then return to ER for further care.    Palpitations A palpitation is the feeling that your heartbeat is irregular or is faster than normal. It may feel like your heart is fluttering or skipping a beat. Palpitations are usually not a serious problem. However, in some cases, you may need further medical evaluation. CAUSES  Palpitations can be caused by:  Smoking.  Caffeine or other stimulants, such as diet pills or energy drinks.  Alcohol.  Stress and anxiety.  Strenuous physical activity.  Fatigue.  Certain medicines.  Heart disease, especially if you have a history of irregular heart rhythms (arrhythmias), such as atrial fibrillation, atrial flutter, or supraventricular tachycardia.  An improperly working pacemaker or defibrillator. DIAGNOSIS  To find the cause of your palpitations, your health care provider will take your medical history and perform a physical exam. Your health care provider may also have you take a test called an ambulatory electrocardiogram (ECG). An ECG records your heartbeat patterns over a 24-hour period. You may also have other tests, such as:  Transthoracic echocardiogram (TTE). During echocardiography, sound waves are used to evaluate how blood flows through your heart.  Transesophageal echocardiogram (TEE).  Cardiac monitoring. This allows your health care provider to monitor your heart rate and rhythm in real time.  Holter monitor. This is a portable device that records your heartbeat and can help diagnose heart arrhythmias. It allows your health care provider to track your heart activity for several days, if needed.  Stress tests by exercise or by giving medicine that makes the heart beat faster. TREATMENT  Treatment of palpitations depends on the cause of your symptoms and can vary greatly.  Most cases of palpitations do not require any treatment other than time, relaxation, and monitoring your symptoms. Other causes, such as atrial fibrillation, atrial flutter, or supraventricular tachycardia, usually require further treatment. HOME CARE INSTRUCTIONS   Avoid:  Caffeinated coffee, tea, soft drinks, diet pills, and energy drinks.  Chocolate.  Alcohol.  Stop smoking if you smoke.  Reduce your stress and anxiety. Things that can help you relax include:  A method of controlling things in your body, such as your heartbeats, with your mind (biofeedback).  Yoga.  Meditation.  Physical activity such as swimming, jogging, or walking.  Get plenty of rest and sleep. SEEK MEDICAL CARE IF:   You continue to have a fast or irregular heartbeat beyond 24 hours.  Your palpitations occur more often. SEEK IMMEDIATE MEDICAL CARE IF:  You have chest pain or shortness of breath.  You have a severe headache.  You feel dizzy or you faint. MAKE SURE YOU:  Understand these instructions.  Will watch your condition.  Will get help right away if you are not doing well or get worse. Document Released: 08/21/2000 Document Revised: 08/29/2013 Document Reviewed: 10/23/2011 [facility] Patient Information 2015 [place], [place]. This information is not intended to replace advice given to you by your health care provider. Make sure you discuss any questions you have with your health care provider.

## 2014-11-23 IMAGING — CR DG CHEST 2V
2 series · 2 of 2 positions shown · non-contrast
Comparison: None.

CLINICAL DATA: Heart palpitations.  Shortness of breath.

CHEST - 2 VIEW

[w chest pa]
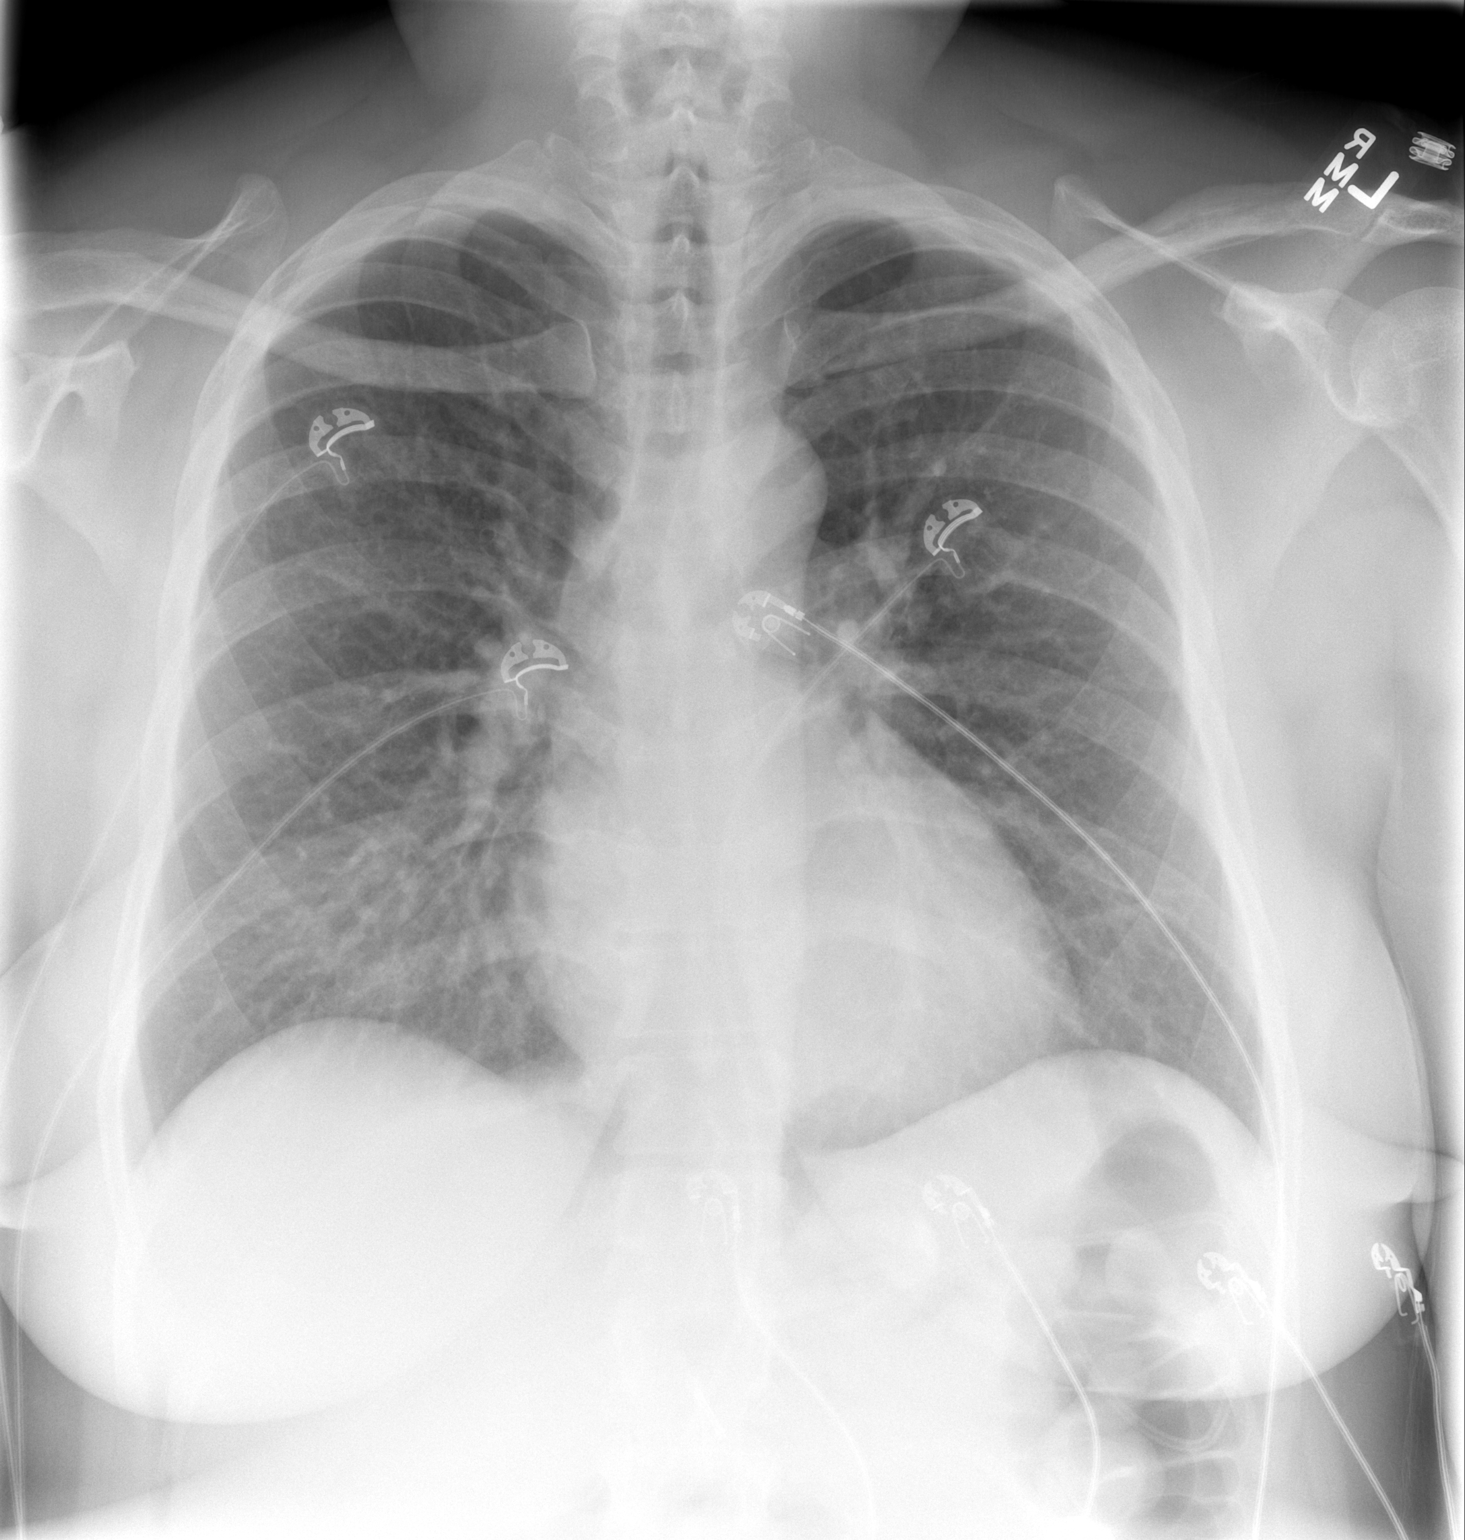

[w chest lat]
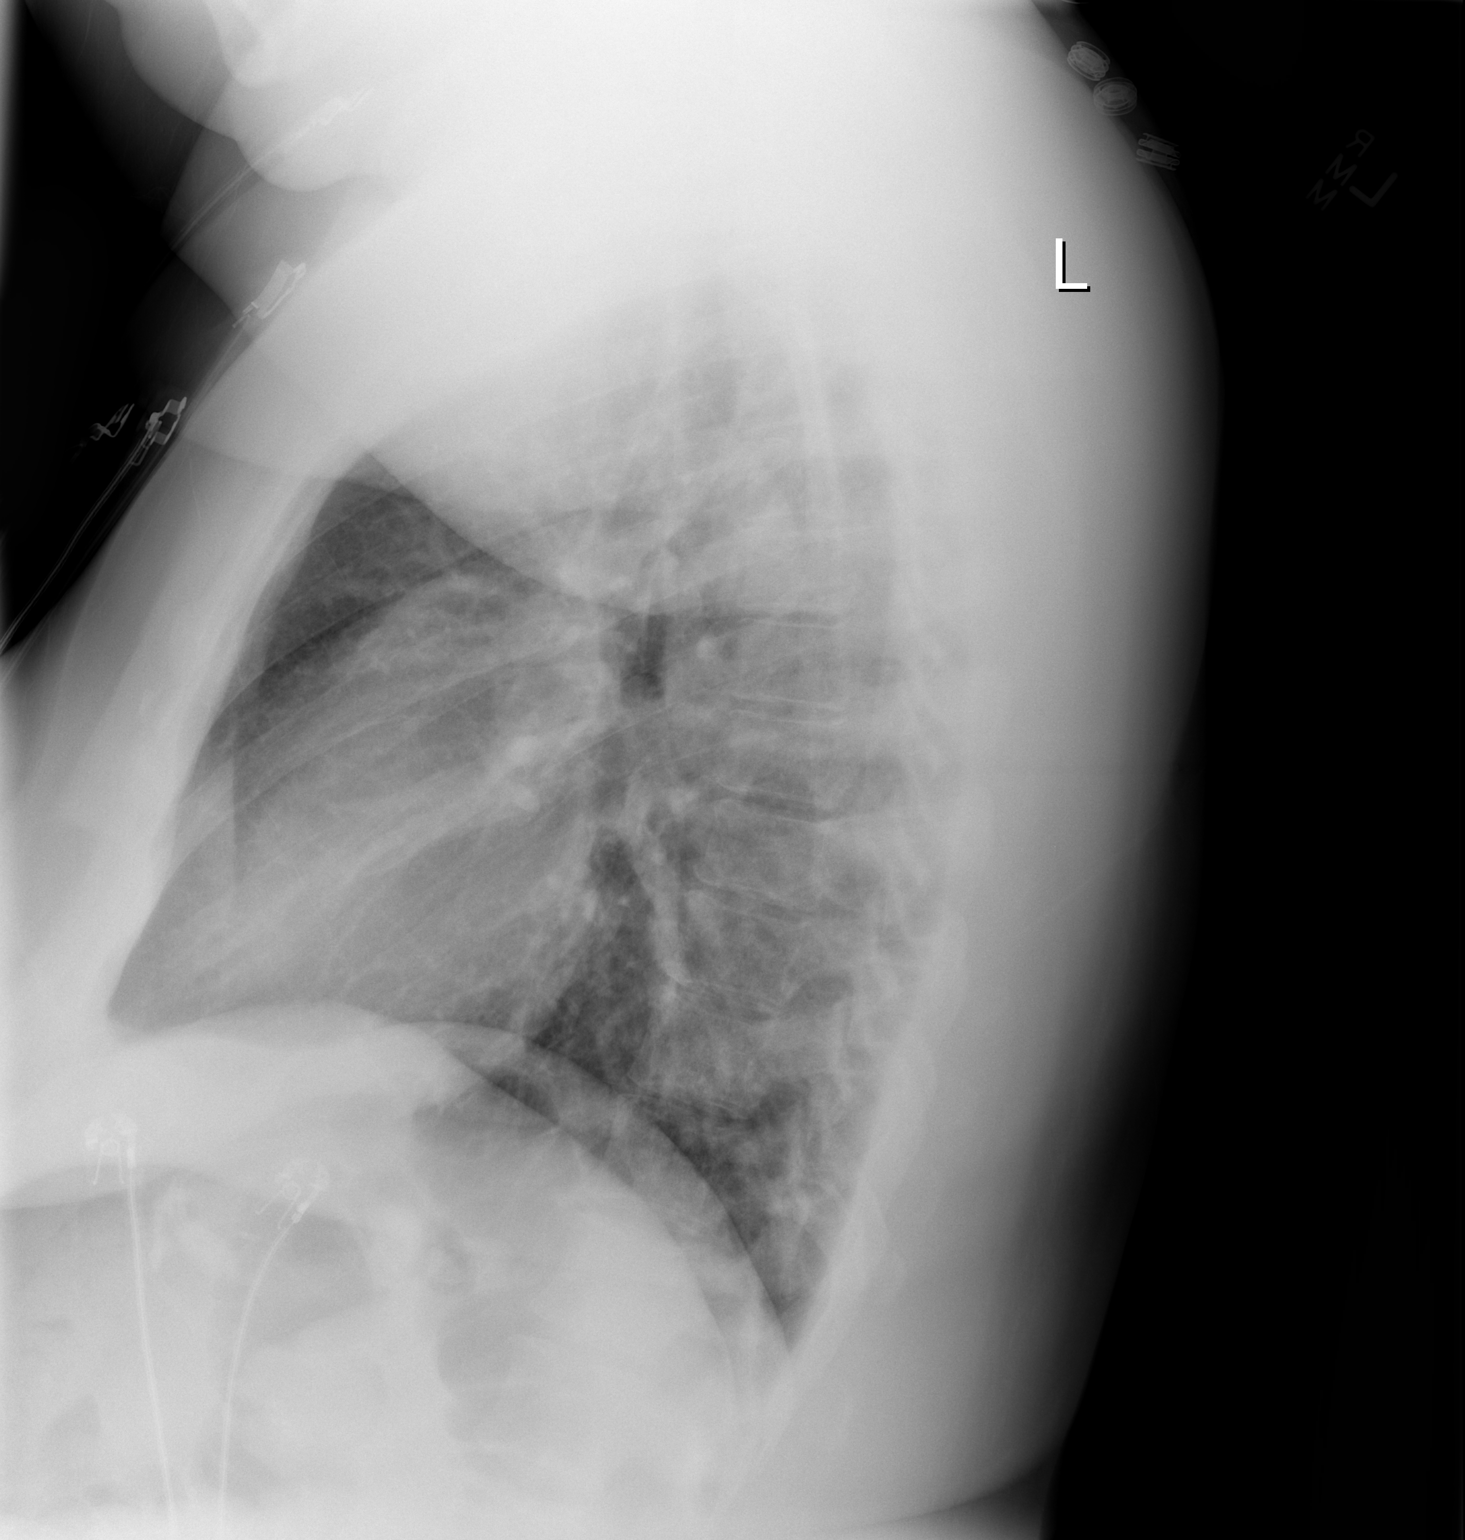

[2 of 2 positions shown; findings below may reference images not displayed]

FINDINGS: Lungs are clear.  Heart size is normal.  No pneumothorax
or pleural effusion.
IMPRESSION: Negative chest.

## 2016-05-24 IMAGING — CR DG CHEST 2V
2 series · 2 of 2 positions shown · non-contrast
Comparison: 04/24/2013

CLINICAL DATA: Palpitations starting last night, shortness of
Breath

EXAM:
CHEST  2 VIEW

[w chest pa]
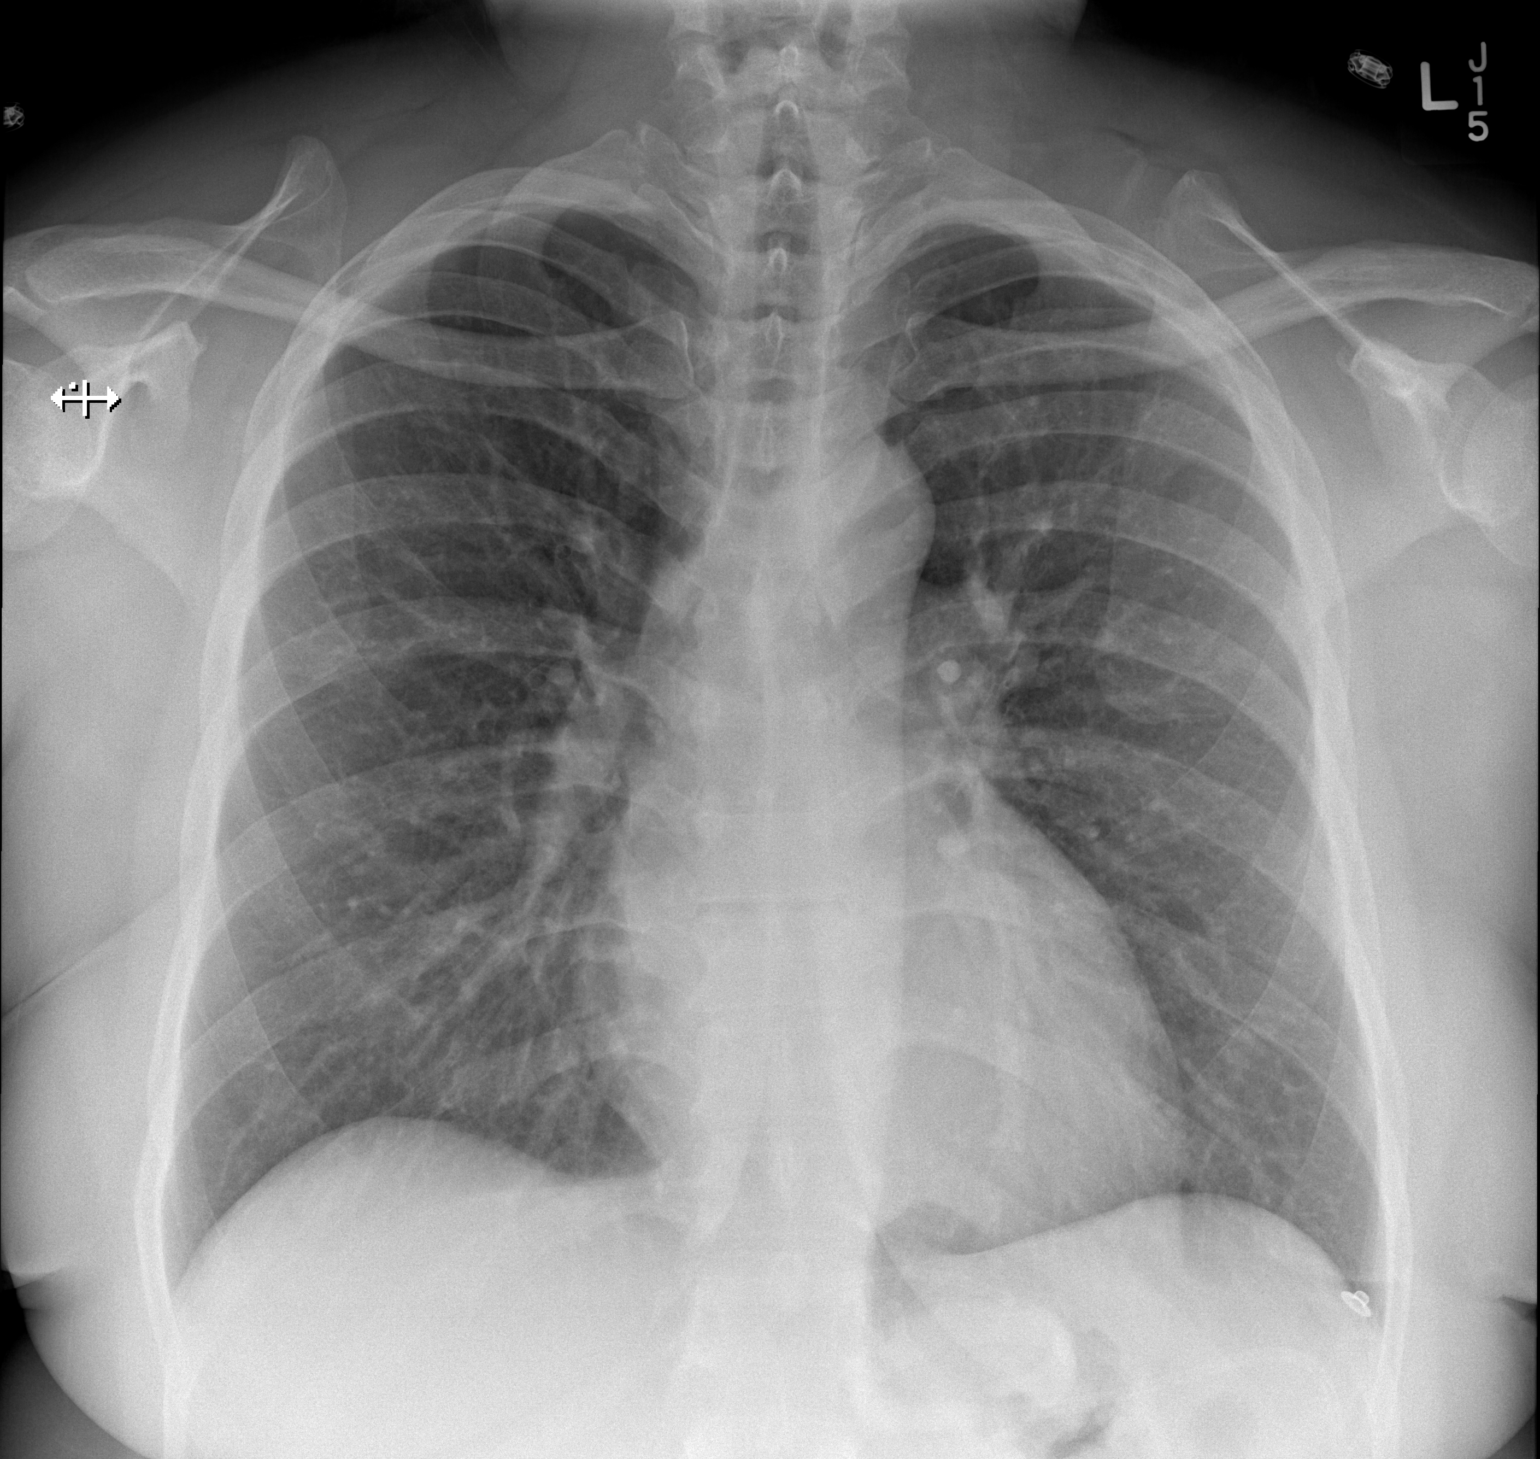

[w chest lat]
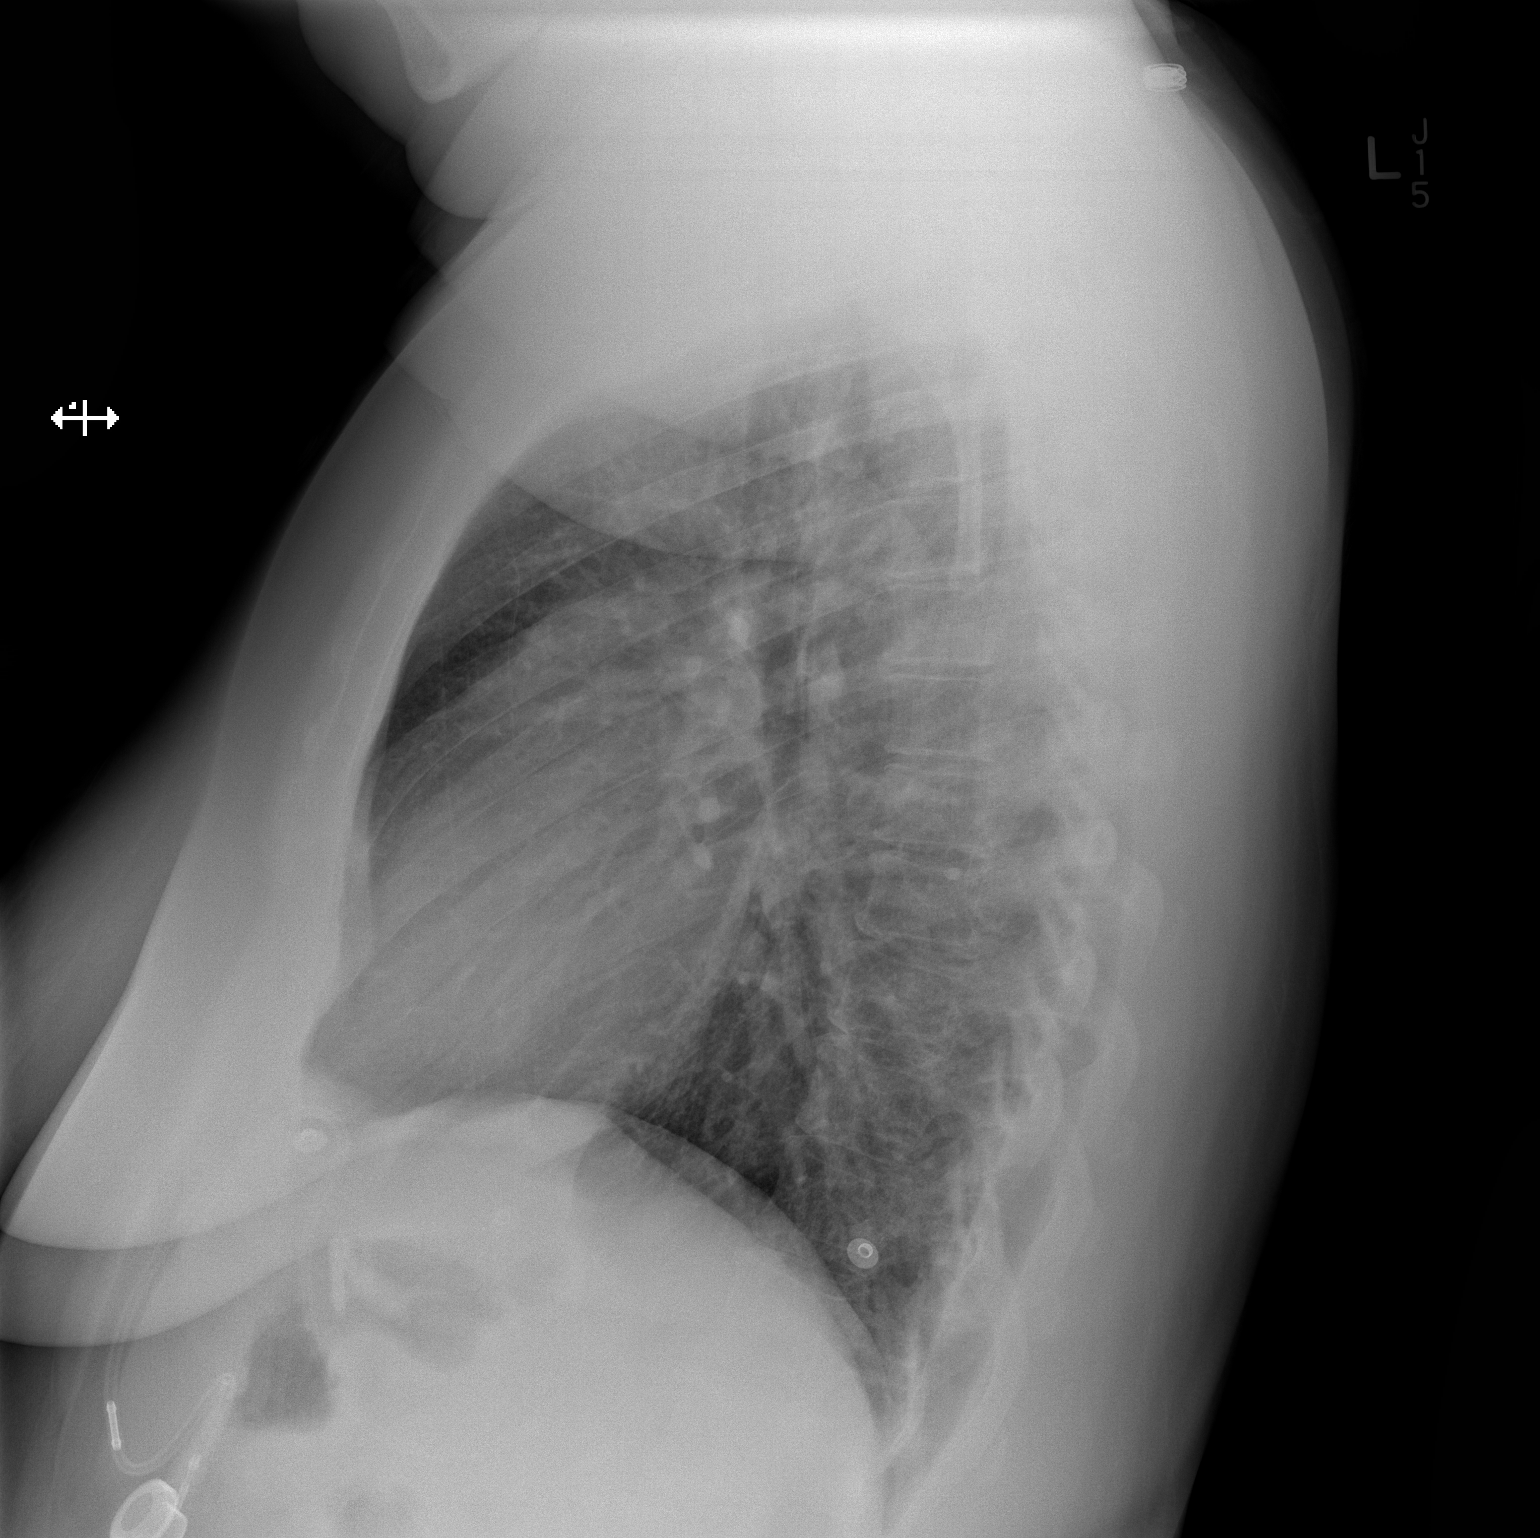

[2 of 2 positions shown; findings below may reference images not displayed]

FINDINGS: Cardiomediastinal silhouette is stable. No acute infiltrate or
pulmonary edema. Minimal degenerative changes mid thoracic spine.
IMPRESSION: No active cardiopulmonary disease.

## 2022-01-21 ENCOUNTER — Other Ambulatory Visit (HOSPITAL_COMMUNITY): Payer: Self-pay

## 2022-01-21 MED ORDER — PROGESTERONE 200 MG PO CAPS
200.0000 mg | ORAL_CAPSULE | Freq: Every day | ORAL | 2 refills | Status: AC
  Filled 2022-01-21: qty 90, 90d supply, fill #0

## 2022-01-21 MED ORDER — VENLAFAXINE HCL ER 75 MG PO CP24
75.0000 mg | ORAL_CAPSULE | Freq: Every day | ORAL | 0 refills | Status: DC
  Filled 2022-01-21: qty 90, 90d supply, fill #0

## 2022-01-21 MED ORDER — VALACYCLOVIR HCL 1 G PO TABS
1000.0000 mg | ORAL_TABLET | Freq: Two times a day (BID) | ORAL | 2 refills | Status: AC
  Filled 2022-01-21: qty 90, 75d supply, fill #0

## 2022-01-21 MED ORDER — METOPROLOL SUCCINATE ER 50 MG PO TB24
50.0000 mg | ORAL_TABLET | Freq: Every day | ORAL | 2 refills | Status: AC
  Filled 2022-01-21: qty 90, 90d supply, fill #0

## 2022-01-21 MED ORDER — VALACYCLOVIR HCL 1 G PO TABS
1000.0000 mg | ORAL_TABLET | Freq: Two times a day (BID) | ORAL | 2 refills | Status: AC
  Filled 2022-01-21: qty 90, 45d supply, fill #0

## 2022-01-22 ENCOUNTER — Other Ambulatory Visit (HOSPITAL_COMMUNITY): Payer: Self-pay

## 2022-01-28 ENCOUNTER — Other Ambulatory Visit (HOSPITAL_COMMUNITY): Payer: Self-pay

## 2022-01-28 MED ORDER — TEMAZEPAM 15 MG PO CAPS
15.0000 mg | ORAL_CAPSULE | Freq: Every day | ORAL | 2 refills | Status: AC
  Filled 2022-01-28: qty 30, 30d supply, fill #0
  Filled 2022-02-26: qty 30, 30d supply, fill #1

## 2022-01-28 MED ORDER — VENLAFAXINE HCL ER 75 MG PO CP24
75.0000 mg | ORAL_CAPSULE | Freq: Every day | ORAL | 0 refills | Status: AC
Start: 2022-01-28 — End: ?

## 2022-02-17 ENCOUNTER — Other Ambulatory Visit (HOSPITAL_COMMUNITY): Payer: Self-pay

## 2022-02-24 ENCOUNTER — Other Ambulatory Visit (HOSPITAL_COMMUNITY): Payer: Self-pay

## 2022-02-26 ENCOUNTER — Other Ambulatory Visit (HOSPITAL_COMMUNITY): Payer: Self-pay

## 2022-03-24 ENCOUNTER — Other Ambulatory Visit (HOSPITAL_COMMUNITY): Payer: Self-pay

## 2022-03-25 ENCOUNTER — Other Ambulatory Visit (HOSPITAL_COMMUNITY): Payer: Self-pay

## 2023-10-12 ENCOUNTER — Ambulatory Visit: Payer: Self-pay

## 2023-10-12 NOTE — Telephone Encounter (Signed)
Second attempt to reach pt. Left message to call back. 

## 2023-10-12 NOTE — Telephone Encounter (Signed)
Message from Milan E sent at 10/12/2023 11:41 AM EST  Summary: Questions about the norovirus   Pt has questions about the norovirus, requesting nurse advice  Best contact: [phone]        Called pt and LM on VM to CB.

## 2023-10-13 ENCOUNTER — Other Ambulatory Visit: Payer: Self-pay

## 2023-10-13 ENCOUNTER — Emergency Department (HOSPITAL_BASED_OUTPATIENT_CLINIC_OR_DEPARTMENT_OTHER)
Admission: EM | Admit: 2023-10-13 | Discharge: 2023-10-13 | Disposition: A | Discharge status: Home / Self Care | Payer: PRIVATE HEALTH INSURANCE | Attending: Emergency Medicine | Admitting: Emergency Medicine

## 2023-10-13 ENCOUNTER — Encounter (HOSPITAL_BASED_OUTPATIENT_CLINIC_OR_DEPARTMENT_OTHER): Payer: Self-pay

## 2023-10-13 ENCOUNTER — Emergency Department (HOSPITAL_BASED_OUTPATIENT_CLINIC_OR_DEPARTMENT_OTHER): Payer: PRIVATE HEALTH INSURANCE

## 2023-10-13 DIAGNOSIS — Z20822 Contact with and (suspected) exposure to covid-19: Secondary | ICD-10-CM | POA: Diagnosis not present

## 2023-10-13 DIAGNOSIS — R110 Nausea: Secondary | ICD-10-CM | POA: Diagnosis present

## 2023-10-13 DIAGNOSIS — M25511 Pain in right shoulder: Secondary | ICD-10-CM | POA: Diagnosis not present

## 2023-10-13 LAB — CBC WITH DIFFERENTIAL/PLATELET
Abs Immature Granulocytes: 0.01 10*3/uL (ref 0.00–0.07)
Basophils Absolute: 0 10*3/uL (ref 0.0–0.1)
Basophils Relative: 1 %
Eosinophils Absolute: 0.1 10*3/uL (ref 0.0–0.5)
Eosinophils Relative: 2 %
HCT: 42.5 % (ref 36.0–46.0)
Hemoglobin: 13.5 g/dL (ref 12.0–15.0)
Immature Granulocytes: 0 %
Lymphocytes Relative: 36 %
Lymphs Abs: 2.6 10*3/uL (ref 0.7–4.0)
MCH: 29 pg (ref 26.0–34.0)
MCHC: 31.8 g/dL (ref 30.0–36.0)
MCV: 91.2 fL (ref 80.0–100.0)
Monocytes Absolute: 0.5 10*3/uL (ref 0.1–1.0)
Monocytes Relative: 7 %
Neutro Abs: 4 10*3/uL (ref 1.7–7.7)
Neutrophils Relative %: 54 %
Platelets: 299 10*3/uL (ref 150–400)
RBC: 4.66 MIL/uL (ref 3.87–5.11)
RDW: 13.2 % (ref 11.5–15.5)
WBC: 7.3 10*3/uL (ref 4.0–10.5)
nRBC: 0 % (ref 0.0–0.2)

## 2023-10-13 LAB — COMPREHENSIVE METABOLIC PANEL
ALT: 32 U/L (ref 0–44)
AST: 26 U/L (ref 15–41)
Albumin: 3.8 g/dL (ref 3.5–5.0)
Alkaline Phosphatase: 124 U/L (ref 38–126)
Anion gap: 7 (ref 5–15)
BUN: 15 mg/dL (ref 6–20)
CO2: 25 mmol/L (ref 22–32)
Calcium: 8.9 mg/dL (ref 8.9–10.3)
Chloride: 107 mmol/L (ref 98–111)
Creatinine, Ser: 0.92 mg/dL (ref 0.44–1.00)
GFR, Estimated: >60 mL/min (ref >60)
Glucose, Bld: 78 mg/dL (ref 70–99)
Potassium: 3.9 mmol/L (ref 3.5–5.1)
Sodium: 139 mmol/L (ref 135–145)
Total Bilirubin: 0.7 mg/dL (ref 0.0–1.2)
Total Protein: 7.8 g/dL (ref 6.5–8.1)

## 2023-10-13 LAB — RESP PANEL BY RT-PCR (RSV, FLU A&B, COVID)  RVPGX2
Influenza A by PCR: NEGATIVE
Influenza B by PCR: NEGATIVE
Resp Syncytial Virus by PCR: NEGATIVE
SARS Coronavirus 2 by RT PCR: NEGATIVE

## 2023-10-13 LAB — LIPASE, BLOOD: Lipase: 33 U/L (ref 11–51)

## 2023-10-13 LAB — TROPONIN I (HIGH SENSITIVITY): Troponin I (High Sensitivity): 2 ng/L (ref <18)

## 2023-10-13 MED ORDER — PROMETHAZINE HCL 25 MG PO TABS: 25.0000 mg | ORAL_TABLET | Freq: Four times a day (QID) | ORAL | 0 refills | Status: AC | PRN

## 2023-10-13 NOTE — Discharge Instructions (Addendum)
 It was a pleasure caring for you today.  As discussed, please follow-up with your GI provider.  Seek emergency care if experiencing any new or worsening symptoms.

## 2023-10-13 NOTE — ED Notes (Signed)
 Pt. Was seen by EDP and evaluated and will be discharged according to the assessment of the EDP

## 2023-10-13 NOTE — ED Notes (Signed)
 Pt. Reports she has been nauseated today but no vomiting.  Pt. Has had no diarrhea.

## 2023-10-13 NOTE — ED Triage Notes (Signed)
 Patient arrives POV c/o nausea for the past 3 weeks. Patient states she was evaluated by PCP on Monday and did a pregnancy test and blood work which was ok. Patient reports decreased appetite, but no vomiting. Patient states she feels like she has a low grade. Patient also reports pain in right scapula. Patient states she has all organs in place.

## 2023-10-13 NOTE — ED Provider Notes (Signed)
 [facility] Provider Note   CSN: [id number] Arrival date & time: 10/13/23  1149     History  Chief Complaint  Patient presents with   Nausea    Leslie Day is a 53 y.o. female with PMHx anxiety, depression, DOE, GERD, palpitations who presents to ED concerned for nausea x3 weeks. Patient seen by PCP 3 days ago with normal workup. Patient also with intermittent pain in her right scapula - which is reproducible to palpation. Also stating that she has a hx of PUD seen by her outpatient GI provider years ago, but states that her symptoms today are more mild than when she had an ulcer. Patient stating that her PCP prescribed her zofran  without help. Patient took a couple doses of her friends phenergan  and stating that this is the only medication that has brought her relief.  Denies fever, chest pain, dyspnea, cough, nausea, vomiting, diarrhea, dysuria, hematuria, hematochezia. Last BM today.   HPI     Home Medications Prior to Admission medications   Medication Sig Start Date End Date Taking? Authorizing Provider  promethazine  (PHENERGAN ) 25 MG tablet Take 1 tablet (25 mg total) by mouth every 6 (six) hours as needed for up to 5 doses for nausea or vomiting. 10/13/23  Yes Hoy Fraction F, PA-C  ciprofloxacin  (CIPRO ) 500 MG tablet Take 1 tablet (500 mg total) by mouth 2 (two) times daily. 06/04/13   Ziegler, Brent, MD  escitalopram  (LEXAPRO ) 20 MG tablet Take 20 mg by mouth at bedtime.     [provider]  HYDROcodone -acetaminophen  (NORCO/VICODIN) 5-325 MG per tablet Take 1-2 tablets by mouth every 4 (four) hours as needed for pain. 06/04/13   Ziegler, Brent, MD  metoprolol  succinate (TOPROL -XL) 50 MG 24 hr tablet Take 1 tablet (50 mg total) by mouth daily. 11/17/21     metoprolol  tartrate (LOPRESSOR ) 25 MG tablet Take 25 mg by mouth daily as needed (for palpitations).    [provider]  metroNIDAZOLE  (FLAGYL ) 500 MG  tablet Take 1 tablet (500 mg total) by mouth every 8 (eight) hours. 06/04/13   Ziegler, Brent, MD  progesterone  (PROMETRIUM ) 200 MG capsule Take 1 capsule (200 mg total) by mouth daily. 10/06/21     temazepam  (RESTORIL ) 15 MG capsule Take 1 capsule (15 mg total) by mouth at bedtime. 01/28/22     valACYclovir  (VALTREX ) 1000 MG tablet Take 1 tablet by mouth 2 times daily for 3 days as needed then take 1 tablet by mouth daily for outbreak or suppression 03/02/21     valACYclovir  (VALTREX ) 1000 MG tablet Take 1 tablet by mouth 2  times daily for 3 days as needed then 1 tablet daily for outbreak or suppression 09/05/21     valACYclovir  (VALTREX ) 500 MG tablet Take 500 mg by mouth daily.     [provider]  venlafaxine  XR (EFFEXOR -XR) 75 MG 24 hr capsule Take 1 capsule (75 mg total) by mouth daily. 01/28/22     zolpidem  (AMBIEN  CR) 12.5 MG CR tablet Take 12.5 mg by mouth at bedtime as needed for sleep.    [provider]      Allergies    Ativan  [lorazepam ], Penicillins, Amoxicillin, and Aripiprazole    Review of Systems   Review of Systems  Gastrointestinal:  Positive for nausea.    Physical Exam Updated Vital Signs BP (!) 142/70 (BP Location: Left Arm)   Pulse 64   Temp 98.4 F (36.9 C)   Resp  18   Ht 5' 7 (1.702 m)   Wt (!) 145.2 kg   LMP 10/22/2014   SpO2 100%   BMI 50.12 kg/m  Physical Exam Vitals and nursing note reviewed.  Constitutional:      General: She is not in acute distress.    Appearance: She is not ill-appearing or toxic-appearing.  HENT:     Head: Normocephalic and atraumatic.     Mouth/Throat:     Mouth: Mucous membranes are moist.  Eyes:     General: No scleral icterus.       Right eye: No discharge.        Left eye: No discharge.     Conjunctiva/sclera: Conjunctivae normal.  Cardiovascular:     Rate and Rhythm: Normal rate and regular rhythm.     Pulses: Normal pulses.     Heart sounds: Normal heart sounds. No murmur heard. Pulmonary:      Effort: Pulmonary effort is normal. No respiratory distress.     Breath sounds: Normal breath sounds. No wheezing, rhonchi or rales.  Abdominal:     General: Abdomen is flat. Bowel sounds are normal. There is no distension.     Palpations: Abdomen is soft. There is no mass.     Tenderness: There is no abdominal tenderness.  Musculoskeletal:     Right lower leg: No edema.     Left lower leg: No edema.     Comments: Tenderness to palpation of right trapezius muscle  Skin:    General: Skin is warm and dry.     Findings: No rash.  Neurological:     General: No focal deficit present.     Mental Status: She is alert and oriented to person, place, and time. Mental status is at baseline.  Psychiatric:        Mood and Affect: Mood normal.        Behavior: Behavior normal.     ED Results / Procedures / Treatments   Labs (all labs ordered are listed, but only abnormal results are displayed) Labs Reviewed  RESP PANEL BY RT-PCR (RSV, FLU A&B, COVID)  [id number]  CBC WITH DIFFERENTIAL/PLATELET  COMPREHENSIVE METABOLIC PANEL  LIPASE, BLOOD  TROPONIN I (HIGH SENSITIVITY)  TROPONIN I (HIGH SENSITIVITY)    EKG None  Radiology DG Chest Portable 1 View Result Date: 10/13/2023 CLINICAL DATA:  Cough.  Nausea. EXAM: PORTABLE CHEST 1 VIEW COMPARISON:  10/24/2014. FINDINGS: Low lung volume. Bilateral lung fields are clear. Bilateral costophrenic angles are clear. Normal cardio-mediastinal silhouette. No acute osseous abnormalities. The soft tissues are within normal limits. IMPRESSION: No active disease. Electronically Signed   By: Ree Molt M.D.   On: 10/13/2023 14:36    Procedures Procedures    Medications Ordered in ED Medications - No data to display  ED Course/ Medical Decision Making/ A&P                                 Medical Decision Making Amount and/or Complexity of Data Reviewed Labs: ordered. Radiology: ordered.    This patient presents to the ED for concern of nausea,  this involves an extensive number of treatment options, and is a complaint that carries with it a high risk of complications and morbidity.  The differential diagnosis includes gastroenteritis, colitis, small bowel obstruction, appendicitis, cholecystitis, pancreatitis, nephrolithiasis, UTI, pyelonephritis   Co morbidities that complicate the patient evaluation   anxiety, depression, DOE, GERD, palpitations  Additional history obtained:  Dr. Samie PCP   Problem List / ED Course / Critical interventions / Medication management  Patient presented for nausea x 3 weeks.  Patient tolerating PO intake, but stating that her nausea increases when she eats.  Patient tried Zofran  prescribed by her PCP which did not provide any relief.  Patient stating that she took some of her friends Phenergan  which is only medicine that has been providing relief.  Patient requesting more Phenergan .  No other infectious symptoms.  Physical exam unremarkable.  Patient afebrile with stable vitals. I Ordered, and personally interpreted labs.  Troponin within normal limits.  CMP unremarkable.  Respiratory panel negative.  Lipase within normal limits.  CBC without leukocytosis or anemia. Triage provider ordering chest xray. I independently visualized and interpreted imaging and I agree with the radiologist interpretation of no acute cardiopulmonary disease.. I personally viewed and interpreted the EKG/cardiac monitored which showed an underlying rhythm of: Sinus rhythm.  QTc within normal limits. Shared results with patient and answered all her questions.  Recommended following up with her GI provider as she may be due for another colonoscopy/endoscopy.  Patient verbalized understanding of plan. I have reviewed the patients home medicines and have made adjustments as needed Patient afebrile with stable vitals.  Provided with return precautions.  Discharged in good condition.  Ddx: These are considered less likely due to  history of present illness and physical exam. -gastroenteritis: No vomiting in ED; no fever; tolerating PO intake -colitis: Denies diarrhea, patient afebrile  -small bowel obstruction: Last BM today  -appendicitis: Negative McBurney point, rebound tenderness, psoas, obturator sign  -cholecystitis: Negative Murphy sign; liver enzymes within normal limits  -pancreatitis: No LUQ tenderness to palpation, lipase within normal limits  -nephrolithiasis: Denies flank pain and urinary complaints  -UTI/pyelonephritis: Denies urinary complaints    Social Determinants of Health:  none          Final Clinical Impression(s) / ED Diagnoses Final diagnoses:  Nausea    Rx / DC Orders ED Discharge Orders          Ordered    promethazine  (PHENERGAN ) 25 MG tablet  Every 6 hours PRN        10/13/23 1449              Hoy Nidia FALCON, PA-C 10/13/23 1457    Ruthe Cornet, DO 10/16/23 0720
